# Patient Record
Sex: Female | Born: 1960 | Race: Black or African American | Hispanic: No | Marital: Married | Smoking: Former smoker
Health system: Southern US, Community
[De-identification: ages and names within clinical notes are randomized; demographics above are authoritative.]

## PROBLEM LIST (undated history)

## (undated) DIAGNOSIS — R609 Edema, unspecified: Secondary | ICD-10-CM

## (undated) DIAGNOSIS — J302 Other seasonal allergic rhinitis: Secondary | ICD-10-CM

## (undated) DIAGNOSIS — Z9109 Other allergy status, other than to drugs and biological substances: Secondary | ICD-10-CM

## (undated) DIAGNOSIS — B0089 Other herpesviral infection: Secondary | ICD-10-CM

## (undated) DIAGNOSIS — T7840XA Allergy, unspecified, initial encounter: Secondary | ICD-10-CM

## (undated) DIAGNOSIS — D869 Sarcoidosis, unspecified: Secondary | ICD-10-CM

## (undated) DIAGNOSIS — K219 Gastro-esophageal reflux disease without esophagitis: Secondary | ICD-10-CM

## (undated) DIAGNOSIS — E611 Iron deficiency: Secondary | ICD-10-CM

## (undated) DIAGNOSIS — M1990 Unspecified osteoarthritis, unspecified site: Secondary | ICD-10-CM

## (undated) HISTORY — DX: Edema, unspecified: R60.9

## (undated) HISTORY — PX: WISDOM TOOTH EXTRACTION: SHX21

## (undated) HISTORY — DX: Sarcoidosis, unspecified: D86.9

## (undated) HISTORY — DX: Unspecified osteoarthritis, unspecified site: M19.90

## (undated) HISTORY — PX: DILATION AND CURETTAGE OF UTERUS: SHX78

## (undated) HISTORY — DX: Gastro-esophageal reflux disease without esophagitis: K21.9

## (undated) HISTORY — DX: Iron deficiency: E61.1

## (undated) HISTORY — DX: Allergy, unspecified, initial encounter: T78.40XA

## (undated) HISTORY — DX: Other herpesviral infection: B00.89

## (undated) HISTORY — DX: Other seasonal allergic rhinitis: J30.2

## (undated) HISTORY — PX: TUBAL LIGATION: SHX77

---

## 1998-05-07 ENCOUNTER — Other Ambulatory Visit: Admission: RE | Admit: 1998-05-07 | Discharge: 1998-05-07 | Payer: Self-pay | Admitting: Internal Medicine

## 1999-02-02 ENCOUNTER — Encounter: Payer: Self-pay | Admitting: Internal Medicine

## 1999-02-02 ENCOUNTER — Encounter: Admission: RE | Admit: 1999-02-02 | Discharge: 1999-02-02 | Payer: Self-pay | Admitting: Internal Medicine

## 1999-03-28 ENCOUNTER — Other Ambulatory Visit: Admission: RE | Admit: 1999-03-28 | Discharge: 1999-03-28 | Payer: Self-pay | Admitting: Internal Medicine

## 1999-07-13 ENCOUNTER — Encounter: Payer: Self-pay | Admitting: Internal Medicine

## 1999-07-13 ENCOUNTER — Encounter: Admission: RE | Admit: 1999-07-13 | Discharge: 1999-07-13 | Payer: Self-pay | Admitting: Internal Medicine

## 2000-02-27 ENCOUNTER — Encounter: Admission: RE | Admit: 2000-02-27 | Discharge: 2000-02-27 | Payer: Self-pay | Admitting: Internal Medicine

## 2000-02-27 ENCOUNTER — Encounter: Payer: Self-pay | Admitting: Internal Medicine

## 2000-06-18 ENCOUNTER — Other Ambulatory Visit: Admission: RE | Admit: 2000-06-18 | Discharge: 2000-06-18 | Payer: Self-pay | Admitting: Internal Medicine

## 2002-02-03 ENCOUNTER — Other Ambulatory Visit: Admission: RE | Admit: 2002-02-03 | Discharge: 2002-02-03 | Payer: Self-pay | Admitting: Internal Medicine

## 2002-02-10 ENCOUNTER — Encounter: Admission: RE | Admit: 2002-02-10 | Discharge: 2002-02-10 | Payer: Self-pay | Admitting: Internal Medicine

## 2002-02-10 ENCOUNTER — Encounter: Payer: Self-pay | Admitting: Internal Medicine

## 2002-02-19 ENCOUNTER — Encounter: Admission: RE | Admit: 2002-02-19 | Discharge: 2002-02-19 | Payer: Self-pay | Admitting: Internal Medicine

## 2002-02-19 ENCOUNTER — Encounter: Payer: Self-pay | Admitting: Internal Medicine

## 2002-04-14 ENCOUNTER — Encounter: Payer: Self-pay | Admitting: Internal Medicine

## 2002-04-14 ENCOUNTER — Encounter: Admission: RE | Admit: 2002-04-14 | Discharge: 2002-04-14 | Payer: Self-pay | Admitting: Internal Medicine

## 2003-04-28 ENCOUNTER — Encounter: Admission: RE | Admit: 2003-04-28 | Discharge: 2003-04-28 | Payer: Self-pay | Admitting: Internal Medicine

## 2003-12-25 ENCOUNTER — Encounter: Admission: RE | Admit: 2003-12-25 | Discharge: 2003-12-25 | Payer: Self-pay | Admitting: Internal Medicine

## 2004-04-08 ENCOUNTER — Other Ambulatory Visit: Admission: RE | Admit: 2004-04-08 | Discharge: 2004-04-08 | Payer: Self-pay | Admitting: Internal Medicine

## 2005-03-03 ENCOUNTER — Encounter: Admission: RE | Admit: 2005-03-03 | Discharge: 2005-03-03 | Payer: Self-pay | Admitting: Internal Medicine

## 2005-07-14 ENCOUNTER — Other Ambulatory Visit: Admission: RE | Admit: 2005-07-14 | Discharge: 2005-07-14 | Payer: Self-pay | Admitting: Internal Medicine

## 2005-07-21 ENCOUNTER — Encounter: Admission: RE | Admit: 2005-07-21 | Discharge: 2005-07-21 | Payer: Self-pay | Admitting: Internal Medicine

## 2006-02-23 ENCOUNTER — Encounter: Admission: RE | Admit: 2006-02-23 | Discharge: 2006-02-23 | Payer: Self-pay | Admitting: Internal Medicine

## 2006-03-06 ENCOUNTER — Encounter: Admission: RE | Admit: 2006-03-06 | Discharge: 2006-03-06 | Payer: Self-pay | Admitting: Internal Medicine

## 2006-08-03 ENCOUNTER — Other Ambulatory Visit: Admission: RE | Admit: 2006-08-03 | Discharge: 2006-08-03 | Payer: Self-pay | Admitting: Internal Medicine

## 2007-03-08 ENCOUNTER — Encounter: Admission: RE | Admit: 2007-03-08 | Discharge: 2007-03-08 | Payer: Self-pay | Admitting: Internal Medicine

## 2007-08-05 ENCOUNTER — Other Ambulatory Visit: Admission: RE | Admit: 2007-08-05 | Discharge: 2007-08-05 | Payer: Self-pay | Admitting: Internal Medicine

## 2007-12-02 ENCOUNTER — Ambulatory Visit: Payer: Self-pay | Admitting: Internal Medicine

## 2007-12-10 ENCOUNTER — Ambulatory Visit: Payer: Self-pay | Admitting: Internal Medicine

## 2007-12-19 ENCOUNTER — Encounter: Admission: RE | Admit: 2007-12-19 | Discharge: 2007-12-19 | Payer: Self-pay | Admitting: Internal Medicine

## 2007-12-19 ENCOUNTER — Ambulatory Visit: Payer: Self-pay | Admitting: Internal Medicine

## 2008-04-13 ENCOUNTER — Ambulatory Visit: Payer: Self-pay | Admitting: Internal Medicine

## 2008-05-06 ENCOUNTER — Encounter: Admission: RE | Admit: 2008-05-06 | Discharge: 2008-05-06 | Payer: Self-pay | Admitting: Internal Medicine

## 2008-10-27 ENCOUNTER — Ambulatory Visit: Payer: Self-pay | Admitting: Internal Medicine

## 2008-10-27 ENCOUNTER — Other Ambulatory Visit: Admission: RE | Admit: 2008-10-27 | Discharge: 2008-10-27 | Payer: Self-pay | Admitting: Internal Medicine

## 2008-11-11 ENCOUNTER — Emergency Department (HOSPITAL_COMMUNITY): Admission: EM | Admit: 2008-11-11 | Discharge: 2008-11-11 | Payer: Self-pay | Admitting: Family Medicine

## 2009-02-09 ENCOUNTER — Ambulatory Visit: Payer: Self-pay | Admitting: Internal Medicine

## 2009-04-08 ENCOUNTER — Ambulatory Visit: Payer: Self-pay | Admitting: Internal Medicine

## 2009-05-18 ENCOUNTER — Encounter: Admission: RE | Admit: 2009-05-18 | Discharge: 2009-05-18 | Payer: Self-pay | Admitting: Internal Medicine

## 2009-11-01 ENCOUNTER — Ambulatory Visit: Payer: Self-pay | Admitting: Internal Medicine

## 2009-11-01 ENCOUNTER — Other Ambulatory Visit: Admission: RE | Admit: 2009-11-01 | Discharge: 2009-11-01 | Payer: Self-pay | Admitting: Internal Medicine

## 2009-11-01 LAB — HM PAP SMEAR

## 2010-01-03 ENCOUNTER — Emergency Department (HOSPITAL_COMMUNITY): Admission: EM | Admit: 2010-01-03 | Discharge: 2010-01-03 | Payer: Self-pay | Admitting: Family Medicine

## 2010-01-06 ENCOUNTER — Ambulatory Visit: Payer: Self-pay | Admitting: Internal Medicine

## 2010-01-19 ENCOUNTER — Ambulatory Visit
Admission: RE | Admit: 2010-01-19 | Discharge: 2010-01-19 | Payer: Self-pay | Source: Home / Self Care | Admitting: Internal Medicine

## 2010-05-12 LAB — POCT URINALYSIS DIP (DEVICE)
Bilirubin Urine: NEGATIVE
Glucose, UA: NEGATIVE mg/dL
Protein, ur: NEGATIVE mg/dL
Specific Gravity, Urine: 1.01 (ref 1.005–1.030)
Urobilinogen, UA: 0.2 mg/dL (ref 0.0–1.0)

## 2010-06-13 ENCOUNTER — Other Ambulatory Visit: Payer: Self-pay | Admitting: Internal Medicine

## 2010-06-13 DIAGNOSIS — Z1231 Encounter for screening mammogram for malignant neoplasm of breast: Secondary | ICD-10-CM

## 2010-06-20 ENCOUNTER — Ambulatory Visit
Admission: RE | Admit: 2010-06-20 | Discharge: 2010-06-20 | Disposition: A | Discharge status: Home / Self Care | Payer: BC Managed Care – PPO | Source: Ambulatory Visit | Attending: Internal Medicine | Admitting: Internal Medicine

## 2010-06-20 DIAGNOSIS — Z1231 Encounter for screening mammogram for malignant neoplasm of breast: Secondary | ICD-10-CM

## 2010-09-30 ENCOUNTER — Inpatient Hospital Stay (INDEPENDENT_AMBULATORY_CARE_PROVIDER_SITE_OTHER)
Admission: RE | Admit: 2010-09-30 | Discharge: 2010-09-30 | Disposition: A | Discharge status: Home / Self Care | Payer: BC Managed Care – PPO | Source: Ambulatory Visit | Attending: Emergency Medicine | Admitting: Emergency Medicine

## 2010-09-30 DIAGNOSIS — R05 Cough: Secondary | ICD-10-CM

## 2010-09-30 DIAGNOSIS — J069 Acute upper respiratory infection, unspecified: Secondary | ICD-10-CM

## 2010-09-30 LAB — POCT URINALYSIS DIP (DEVICE)
Bilirubin Urine: NEGATIVE
Ketones, ur: NEGATIVE mg/dL
Nitrite: NEGATIVE
Urobilinogen, UA: 0.2 mg/dL (ref 0.0–1.0)

## 2010-12-12 ENCOUNTER — Encounter: Payer: Self-pay | Admitting: Internal Medicine

## 2010-12-13 ENCOUNTER — Ambulatory Visit (INDEPENDENT_AMBULATORY_CARE_PROVIDER_SITE_OTHER): Payer: BC Managed Care – PPO | Admitting: Internal Medicine

## 2010-12-13 ENCOUNTER — Encounter: Payer: Self-pay | Admitting: Internal Medicine

## 2010-12-13 VITALS — BP 138/78 | HR 76 | Temp 98.5°F | Ht 61.0 in | Wt 160.0 lb

## 2010-12-13 DIAGNOSIS — N951 Menopausal and female climacteric states: Secondary | ICD-10-CM

## 2010-12-13 DIAGNOSIS — J309 Allergic rhinitis, unspecified: Secondary | ICD-10-CM | POA: Insufficient documentation

## 2010-12-13 DIAGNOSIS — Z23 Encounter for immunization: Secondary | ICD-10-CM

## 2010-12-13 DIAGNOSIS — Z0000 Encounter for general adult medical examination without abnormal findings: Secondary | ICD-10-CM

## 2010-12-13 DIAGNOSIS — Z862 Personal history of diseases of the blood and blood-forming organs and certain disorders involving the immune mechanism: Secondary | ICD-10-CM | POA: Insufficient documentation

## 2010-12-13 DIAGNOSIS — D869 Sarcoidosis, unspecified: Secondary | ICD-10-CM

## 2010-12-13 LAB — COMPREHENSIVE METABOLIC PANEL
AST: 29 U/L (ref 0–37)
Albumin: 4.6 g/dL (ref 3.5–5.2)
Alkaline Phosphatase: 85 U/L (ref 39–117)
BUN: 9 mg/dL (ref 6–23)
CO2: 26 mEq/L (ref 19–32)
Calcium: 9.5 mg/dL (ref 8.4–10.5)
Sodium: 141 mEq/L (ref 135–145)
Total Bilirubin: 0.5 mg/dL (ref 0.3–1.2)
Total Protein: 7.1 g/dL (ref 6.0–8.3)

## 2010-12-13 LAB — POCT URINALYSIS DIPSTICK
Blood, UA: NEGATIVE
Ketones, UA: NEGATIVE
Leukocytes, UA: NEGATIVE
Nitrite, UA: NEGATIVE
Protein, UA: NEGATIVE
pH, UA: 6

## 2010-12-13 LAB — CBC WITH DIFFERENTIAL/PLATELET
Basophils Absolute: 0 10*3/uL (ref 0.0–0.1)
HCT: 38.1 % (ref 36.0–46.0)
Lymphocytes Relative: 50 % — ABNORMAL HIGH (ref 12–46)
MCV: 90.9 fL (ref 78.0–100.0)
Monocytes Absolute: 0.5 10*3/uL (ref 0.1–1.0)
Monocytes Relative: 6 % (ref 3–12)
Platelets: 293 10*3/uL (ref 150–400)
WBC: 8.2 10*3/uL (ref 4.0–10.5)

## 2010-12-13 LAB — LIPID PANEL
LDL Cholesterol: 105 mg/dL — ABNORMAL HIGH (ref 0–99)
Triglycerides: 60 mg/dL (ref <150)

## 2010-12-13 NOTE — Patient Instructions (Signed)
Take your blood pressure several times a week at home and call if blood pressure is greater than 1:30 systolically or 85 diastolically.

## 2010-12-13 NOTE — Progress Notes (Signed)
  Subjective:    Patient ID: Laura Silva, female    DOB: Jun 09, 1960, 50 y.o.   MRN: [id number]  HPI 50 year old black female patient here since 1989 when she presented with fever and cough. She was found to have a chest x-ray consistent with sarcoidosis. Was treated by Dr. Jetty Duhamel with steroids and went into remission with no further episodes. In 1990, Dr. Maple Hudson did allergy testing on her and it was positive for fall weeds, grass, several tree pollens, dust, dust mites, tobacco and mold. She takes over-the-counter allergy medication. Her blood pressures been elevated today but when I rechecked it I got 120/80. She checks it at home and we'll keep an eye on this. History of de Quervain's tenosynovitis November 2002 right wrist. History of bacterial vaginosis. Had tubal ligation 1992. Tetanus immunization given at [facility] hospital 2003. Hepatitis B series given at [facility] hospital in 2003. Last menstrual period was August 2012. FSH is drawn. Patient does not smoke. Works in [employer] at [employer]. Has 2 adult children and is married.  Family history father died with history of diabetes complications. Mother with history of hypertension and hyperlipidemia. One sister with hypertension and diabetes. One sister died with some type of intestinal cancer. One sister morbidly obese had bariatric surgery. Patient has a daughter from a previous relationship his teenage son was killed in a tragic car accident in 2011.    Review of Systems  Constitutional: Negative.   HENT: Negative.   Eyes: Negative.   Respiratory: Negative.   Cardiovascular: Negative.   Gastrointestinal: Negative.   Genitourinary:       Initial. Since August 2012. Last Pap smear September 2011  Neurological: Negative.   Hematological: Negative.   Psychiatric/Behavioral: Negative.        Objective:   Physical Exam  Vitals reviewed. Constitutional: She appears well-developed and well-nourished.  HENT:  Head: Normocephalic  and atraumatic.  Right Ear: External ear normal.  Left Ear: External ear normal.  Mouth/Throat: Oropharynx is clear and moist.  Eyes: Conjunctivae and EOM are normal. Pupils are equal, round, and reactive to light. No scleral icterus.  Neck: Neck supple. No JVD present. No thyromegaly present.  Cardiovascular: Normal rate, regular rhythm, normal heart sounds and intact distal pulses.   No murmur heard. Pulmonary/Chest: Effort normal and breath sounds normal. She has no wheezes. She has no rales.       Breasts normal female  Abdominal: Soft. Bowel sounds are normal. She exhibits no distension and no mass. There is no tenderness.  Genitourinary:       Bimanual exam normal  Musculoskeletal: She exhibits no edema.  Lymphadenopathy:    She has no cervical adenopathy.  Skin: Skin is warm and dry.  Psychiatric: She has a normal mood and affect. Her behavior is normal.          Assessment & Plan:  History of sarcoidosis-now in remission  Allergic rhinitis  Mild obesity  Plan: At her request phentermine 37.5 mg (#100) one by mouth every morning with no refill. Patient is given on her blood pressure at home and call me if blood pressure is exceeding 1:30 systolically or 85 diastolically on a regular basis. Return one year or as needed. Needs to consider screening colonoscopy and is due for mammogram.

## 2010-12-14 LAB — FOLLICLE STIMULATING HORMONE: FSH: 103.3 m[IU]/mL

## 2010-12-14 LAB — VITAMIN D 25 HYDROXY (VIT D DEFICIENCY, FRACTURES): Vit D, 25-Hydroxy: 39 ng/mL (ref 30–89)

## 2011-02-07 HISTORY — PX: COLONOSCOPY: SHX174

## 2011-02-21 ENCOUNTER — Telehealth: Payer: Self-pay

## 2011-02-24 NOTE — Telephone Encounter (Signed)
Patient wants colonoscopy scheduled for June 2013, but [facility] does not have their schedule till April. Patient informed.

## 2011-03-13 ENCOUNTER — Ambulatory Visit: Payer: BC Managed Care – PPO | Admitting: Internal Medicine

## 2011-04-06 ENCOUNTER — Ambulatory Visit
Admission: RE | Admit: 2011-04-06 | Discharge: 2011-04-06 | Disposition: A | Discharge status: Home / Self Care | Payer: BC Managed Care – PPO | Source: Ambulatory Visit | Attending: Internal Medicine | Admitting: Internal Medicine

## 2011-04-06 ENCOUNTER — Encounter: Payer: Self-pay | Admitting: Internal Medicine

## 2011-04-06 ENCOUNTER — Ambulatory Visit (INDEPENDENT_AMBULATORY_CARE_PROVIDER_SITE_OTHER): Payer: BC Managed Care – PPO | Admitting: Internal Medicine

## 2011-04-06 VITALS — BP 130/82 | HR 72 | Temp 98.9°F | Wt 174.0 lb

## 2011-04-06 DIAGNOSIS — M5430 Sciatica, unspecified side: Secondary | ICD-10-CM

## 2011-04-06 DIAGNOSIS — G5600 Carpal tunnel syndrome, unspecified upper limb: Secondary | ICD-10-CM

## 2011-04-06 DIAGNOSIS — G5603 Carpal tunnel syndrome, bilateral upper limbs: Secondary | ICD-10-CM

## 2011-04-06 DIAGNOSIS — K5900 Constipation, unspecified: Secondary | ICD-10-CM

## 2011-04-06 DIAGNOSIS — R52 Pain, unspecified: Secondary | ICD-10-CM

## 2011-04-06 DIAGNOSIS — R143 Flatulence: Secondary | ICD-10-CM

## 2011-04-06 DIAGNOSIS — R140 Abdominal distension (gaseous): Secondary | ICD-10-CM

## 2011-04-07 LAB — CBC WITH DIFFERENTIAL/PLATELET
Basophils Absolute: 0 10*3/uL (ref 0.0–0.1)
Eosinophils Absolute: 0.1 10*3/uL (ref 0.0–0.7)
HCT: 37 % (ref 36.0–46.0)
Lymphs Abs: 3.8 10*3/uL (ref 0.7–4.0)
MCH: 28.8 pg (ref 26.0–34.0)
Monocytes Relative: 6 % (ref 3–12)
Platelets: 318 10*3/uL (ref 150–400)
RBC: 4.16 MIL/uL (ref 3.87–5.11)

## 2011-04-07 LAB — ANA: Anti Nuclear Antibody(ANA): NEGATIVE

## 2011-04-07 LAB — SEDIMENTATION RATE: Sed Rate: 10 mm/hr (ref 0–22)

## 2011-04-27 ENCOUNTER — Ambulatory Visit (INDEPENDENT_AMBULATORY_CARE_PROVIDER_SITE_OTHER): Payer: BC Managed Care – PPO | Admitting: Internal Medicine

## 2011-04-27 ENCOUNTER — Encounter: Payer: Self-pay | Admitting: Internal Medicine

## 2011-04-27 VITALS — BP 120/62 | HR 100 | Temp 98.0°F | Ht 61.0 in | Wt 164.0 lb

## 2011-04-27 DIAGNOSIS — K5900 Constipation, unspecified: Secondary | ICD-10-CM

## 2011-04-27 DIAGNOSIS — M5432 Sciatica, left side: Secondary | ICD-10-CM

## 2011-04-27 DIAGNOSIS — M5430 Sciatica, unspecified side: Secondary | ICD-10-CM

## 2011-04-27 DIAGNOSIS — Z8669 Personal history of other diseases of the nervous system and sense organs: Secondary | ICD-10-CM

## 2011-04-27 NOTE — Patient Instructions (Signed)
Would suggest you return to [employer] for weight loss. Continue diet and exercise. Call if numbness in foot continues beyond 6 weeks or symptoms worsen.

## 2011-04-27 NOTE — Progress Notes (Signed)
  Subjective:    Patient ID: Laura Silva, female    DOB: February 15, 1960, 51 y.o.   MRN: [id number]  HPI  51 year old black female with recent bout of sciatica left leg improved with physical therapy. She went 4 times for physical therapy. Still occasionally having some pain down left lateral leg. Occasionally foot gets a bit numb but not weak. She took steroids but she said they made her "crazy ". Overall is doing well. She's pleased with her weight loss. We had limited her work for light duty but apparently no light duty was available so she just did her work a little more slowly. This worked out okay. No longer complaining of abdominal bloating.    Review of Systems     Objective:   Physical Exam straight leg raising is negative at 90 bilaterally. Muscle strength is normal. Sensation intact in left lower leg.        Assessment & Plan:  Left sciatica improved  Abdominal bloating thought to be due to constipation improved  Plan: Given phentermine 37.5 mg (#30) 1 by mouth daily for weight loss. No refills will be given on this. She understands this. She should return to [employer]. Return when necessary.

## 2011-05-06 NOTE — Patient Instructions (Signed)
He may wear night splints for carpal tunnel syndrome. Sterapred DS 10 mg 6 day dosepak for sciatica and hopefully it will help carpal tunnel syndrome as well. Abdominal film shows constipation. Recommend MiraLAX daily. Return in 3 weeks. Consider physical therapy. Prescription given.

## 2011-05-06 NOTE — Progress Notes (Signed)
  Subjective:    Patient ID: Laura Silva, female    DOB: [birth year], 51 y.o.   MRN: [id number]  HPI 51 year old black female with remote history of sarcoidosis in today with multiple complaints. She is complaining of abdominal bloating. Feels that she has gained weight. Feels always distended in her abdomen. Says she is having regular bowel movements. Also complaining of pain in buttock radiating somewhat into upper posterior thigh. She works in Public affairs consultant. Also has had some numbness in her fingers both hands. She had a sister that died with some type of GI cancer.    Review of Systems     Objective:   Physical Exam abdomen obese but nondistended with no hepatosplenomegaly masses or tenderness; positive Phalen and Tinel signs bilaterally consistent with carpal tunnel syndrome both reassess; straight leg raising is negative at 90 bilaterally. Deep tendon reflexes 2+ and symmetrical. Muscle strength in the lower extremities is normal. KUB shows constipation.        Assessment & Plan:  Sciatica  Carpal tunnel syndrome  Constipation  Plan: Sterapred DS 10 mg 6 day dosepak. Bilateral night sweats will help carpal tunnel syndrome hopefully. If not, can refer to hand surgeon. Abdominal bloating seems to be due to constipation. Recommend MiraLAX daily. Return for followup in 2-4 weeks. Light duty at work. Consider physical therapy.

## 2011-05-30 ENCOUNTER — Encounter: Payer: Self-pay | Admitting: Gastroenterology

## 2011-06-01 ENCOUNTER — Encounter: Payer: Self-pay | Admitting: Internal Medicine

## 2011-06-21 ENCOUNTER — Telehealth: Payer: Self-pay

## 2011-06-21 DIAGNOSIS — Z1211 Encounter for screening for malignant neoplasm of colon: Secondary | ICD-10-CM

## 2011-06-26 NOTE — Telephone Encounter (Signed)
Colonoscopy scheduled for July.

## 2011-08-04 ENCOUNTER — Encounter: Payer: BC Managed Care – PPO | Admitting: Internal Medicine

## 2011-08-08 ENCOUNTER — Encounter: Payer: Self-pay | Admitting: Internal Medicine

## 2011-08-08 ENCOUNTER — Ambulatory Visit (AMBULATORY_SURGERY_CENTER): Payer: BC Managed Care – PPO

## 2011-08-08 VITALS — Ht 61.0 in | Wt 166.9 lb

## 2011-08-08 DIAGNOSIS — Z800 Family history of malignant neoplasm of digestive organs: Secondary | ICD-10-CM

## 2011-08-08 DIAGNOSIS — Z1211 Encounter for screening for malignant neoplasm of colon: Secondary | ICD-10-CM

## 2011-08-08 DIAGNOSIS — Z8371 Family history of colonic polyps: Secondary | ICD-10-CM

## 2011-08-08 MED ORDER — MOVIPREP 100 G PO SOLR: ORAL | Status: DC

## 2011-08-22 ENCOUNTER — Ambulatory Visit (AMBULATORY_SURGERY_CENTER): Payer: BC Managed Care – PPO | Admitting: Internal Medicine

## 2011-08-22 ENCOUNTER — Encounter: Payer: Self-pay | Admitting: Internal Medicine

## 2011-08-22 VITALS — BP 149/73 | HR 81 | Temp 97.9°F | Resp 20 | Ht 61.0 in | Wt 166.0 lb

## 2011-08-22 DIAGNOSIS — D126 Benign neoplasm of colon, unspecified: Secondary | ICD-10-CM

## 2011-08-22 DIAGNOSIS — Z1211 Encounter for screening for malignant neoplasm of colon: Secondary | ICD-10-CM

## 2011-08-22 MED ORDER — SODIUM CHLORIDE 0.9 % IV SOLN: 500.0000 mL | INTRAVENOUS | Status: DC

## 2011-08-22 NOTE — Progress Notes (Signed)
Patient did not experience any of the following events: a burn prior to discharge; a fall within the facility; wrong site/side/patient/procedure/implant event; or a hospital transfer or hospital admission upon discharge from the facility. (G8907) Patient did not have preoperative order for IV antibiotic SSI prophylaxis. (G8918) Patient did not have preoperative order for IV antibiotic SSI prophylaxis. (G8918)  

## 2011-08-22 NOTE — Patient Instructions (Addendum)
One very small polyp was removed. Otherwise normal colonoscopy. I will let you know the polyp pathology results and timning of next colonoscopy (5-10 years).  Thank you for choosing me and [facility].  Iva Boop, MD, FACG  YOU HAD AN ENDOSCOPIC PROCEDURE TODAY AT THE [facility]: Refer to the procedure report that was given to you for any specific questions about what was found during the examination.  If the procedure report does not answer your questions, please call your gastroenterologist to clarify.  If you requested that your care partner not be given the details of your procedure findings, then the procedure report has been included in a sealed envelope for you to review at your convenience later.  YOU SHOULD EXPECT: Some feelings of bloating in the abdomen. Passage of more gas than usual.  Walking can help get rid of the air that was put into your GI tract during the procedure and reduce the bloating. If you had a lower endoscopy (such as a colonoscopy or flexible sigmoidoscopy) you may notice spotting of blood in your stool or on the toilet paper. If you underwent a bowel prep for your procedure, then you may not have a normal bowel movement for a few days.  DIET: Your first meal following the procedure should be a light meal and then it is ok to progress to your normal diet.  A half-sandwich or bowl of soup is an example of a good first meal.  Heavy or fried foods are harder to digest and may make you feel nauseous or bloated.  Likewise meals heavy in dairy and vegetables can cause extra gas to form and this can also increase the bloating.  Drink plenty of fluids but you should avoid alcoholic beverages for 24 hours.  ACTIVITY: Your care partner should take you home directly after the procedure.  You should plan to take it easy, moving slowly for the rest of the day.  You can resume normal activity the day after the procedure however you should NOT DRIVE or use  heavy machinery for 24 hours (because of the sedation medicines used during the test).    SYMPTOMS TO REPORT IMMEDIATELY: A gastroenterologist can be reached at any hour.  During normal business hours, 8:30 AM to 5:00 PM Monday through Friday, call [phone].  After hours and on weekends, please call the GI answering service at [phone] who will take a message and have the physician on call contact you.   Following lower endoscopy (colonoscopy or flexible sigmoidoscopy):  Excessive amounts of blood in the stool  Significant tenderness or worsening of abdominal pains  Swelling of the abdomen that is new, acute  Fever of 100F or higher  Following upper endoscopy (EGD)  Vomiting of blood or coffee ground material  New chest pain or pain under the shoulder blades  Painful or persistently difficult swallowing  New shortness of breath  Fever of 100F or higher  Black, tarry-looking stools  FOLLOW UP: If any biopsies were taken you will be contacted by phone or by letter within the next 1-3 weeks.  Call your gastroenterologist if you have not heard about the biopsies in 3 weeks.  Our staff will call the home number listed on your records the next business day following your procedure to check on you and address any questions or concerns that you may have at that time regarding the information given to you following your procedure. This is a courtesy call and so if  there is no answer at the home number and we have not heard from you through the emergency physician on call, we will assume that you have returned to your regular daily activities without incident.  SIGNATURES/CONFIDENTIALITY: You and/or your care partner have signed paperwork which will be entered into your electronic medical record.  These signatures attest to the fact that that the information above on your After Visit Summary has been reviewed and is understood.  Full responsibility of the confidentiality of this  discharge information lies with you and/or your care-partner.   Polypectomy information given.

## 2011-08-22 NOTE — Op Note (Signed)
[facility] [address] [place], [place]  [address]  COLONOSCOPY PROCEDURE REPORT  PATIENT:  Laura Silva, Laura Silva  MR#:  [id number] BIRTHDATE:  May 27, 1960, 50 yrs. old  GENDER:  female ENDOSCOPIST:  Iva Boop, MD, [facility] REF. BY:  Sharlet Salina, M.D. PROCEDURE DATE:  08/22/2011 PROCEDURE:  Colonoscopy with snare polypectomy ASA CLASS:  Class II INDICATIONS:  Routine Risk Screening MEDICATIONS:   These medications were titrated to patient response per physician's verbal order, MAC sedation, administered by CRNA, propofol (Diprivan) 250 mg IV  DESCRIPTION OF PROCEDURE:   After the risks benefits and alternatives of the procedure were thoroughly explained, informed consent was obtained.  Digital rectal exam was performed and revealed no abnormalities.   The LB CF-[id number] endoscope was introduced through the anus and advanced to the cecum, which was identified by both the appendix and ileocecal valve, without limitations.  The quality of the prep was excellent, using MoviPrep.  The instrument was then slowly withdrawn as the colon was fully examined. <<PROCEDUREIMAGES>>  FINDINGS:  A diminutive 3-82mm polyp was found in the descending colon. Polyp was snared without cautery. Retrieval was successful. This was otherwise a normal examination of the colon. Includes right colon retroflexion.   Retroflexed views in the rectum revealed no abnormalities.    The time to cecum = 2:28 minutes. The scope was then withdrawn in 10:24 minutes from the cecum and the procedure completed. COMPLICATIONS:  None ENDOSCOPIC IMPRESSION: 1) Diminutive polyp in the descending colon - removed 2) Otherwise normal examination. excellent prep  REPEAT EXAM:  In for Colonoscopy, pending biopsy results.  Iva Boop, MD, [facility]  CC:  Sharlet Salina, MD and The Patient  n. eSIGNED:   Iva Boop at 08/22/2011 08:53 AM  Corey Skains, [phone]

## 2011-08-23 ENCOUNTER — Telehealth: Payer: Self-pay | Admitting: *Deleted

## 2011-08-23 NOTE — Telephone Encounter (Signed)
  Follow up Call-  Call back number 08/22/2011  Post procedure Call Back phone  # [phone]  Permission to leave phone message Yes     Patient questions:  Do you have a fever, pain , or abdominal swelling? no Pain Score  0 *  Have you tolerated food without any problems? yes  Have you been able to return to your normal activities? yes  Do you have any questions about your discharge instructions: Diet   no Medications  no Follow up visit  no  Do you have questions or concerns about your Care? no  Actions: * If pain score is 4 or above: No action needed, pain <4.

## 2011-08-29 ENCOUNTER — Encounter: Payer: Self-pay | Admitting: Internal Medicine

## 2011-08-29 NOTE — Progress Notes (Signed)
Quick Note:  Diminutive hyperplastic polyp Repeat colonoscopy about 2023 ______

## 2011-10-12 ENCOUNTER — Other Ambulatory Visit: Payer: Self-pay | Admitting: Internal Medicine

## 2011-10-12 DIAGNOSIS — Z1231 Encounter for screening mammogram for malignant neoplasm of breast: Secondary | ICD-10-CM

## 2011-11-06 ENCOUNTER — Ambulatory Visit
Admission: RE | Admit: 2011-11-06 | Discharge: 2011-11-06 | Disposition: A | Discharge status: Home / Self Care | Payer: BC Managed Care – PPO | Source: Ambulatory Visit | Attending: Internal Medicine | Admitting: Internal Medicine

## 2011-11-06 DIAGNOSIS — Z1231 Encounter for screening mammogram for malignant neoplasm of breast: Secondary | ICD-10-CM

## 2012-01-12 ENCOUNTER — Other Ambulatory Visit: Payer: BC Managed Care – PPO | Admitting: Internal Medicine

## 2012-01-12 DIAGNOSIS — Z0000 Encounter for general adult medical examination without abnormal findings: Secondary | ICD-10-CM

## 2012-01-12 LAB — CBC WITH DIFFERENTIAL/PLATELET
Basophils Absolute: 0 10*3/uL (ref 0.0–0.1)
Basophils Relative: 0 % (ref 0–1)
Eosinophils Relative: 1 % (ref 0–5)
HCT: 36.2 % (ref 36.0–46.0)
Hemoglobin: 12 g/dL (ref 12.0–15.0)
Lymphocytes Relative: 46 % (ref 12–46)
MCHC: 33.1 g/dL (ref 30.0–36.0)
MCV: 89.4 fL (ref 78.0–100.0)
Monocytes Absolute: 0.6 10*3/uL (ref 0.1–1.0)
Monocytes Relative: 8 % (ref 3–12)
RDW: 13.6 % (ref 11.5–15.5)

## 2012-01-12 LAB — LIPID PANEL
HDL: 71 mg/dL (ref >39)
Total CHOL/HDL Ratio: 2.9 Ratio
Triglycerides: 37 mg/dL (ref <150)

## 2012-01-12 LAB — COMPREHENSIVE METABOLIC PANEL
AST: 25 U/L (ref 0–37)
Albumin: 4.5 g/dL (ref 3.5–5.2)
BUN: 14 mg/dL (ref 6–23)
Calcium: 10 mg/dL (ref 8.4–10.5)
Chloride: 105 mEq/L (ref 96–112)
Creat: 0.88 mg/dL (ref 0.50–1.10)
Glucose, Bld: 83 mg/dL (ref 70–99)

## 2012-01-12 LAB — TSH: TSH: 1.812 u[IU]/mL (ref 0.350–4.500)

## 2012-01-15 ENCOUNTER — Encounter: Payer: Self-pay | Admitting: Internal Medicine

## 2012-01-15 ENCOUNTER — Ambulatory Visit (INDEPENDENT_AMBULATORY_CARE_PROVIDER_SITE_OTHER): Payer: BC Managed Care – PPO | Admitting: Internal Medicine

## 2012-01-15 VITALS — BP 130/82 | HR 64 | Temp 98.2°F | Ht 61.0 in | Wt 161.0 lb

## 2012-01-15 DIAGNOSIS — Z0000 Encounter for general adult medical examination without abnormal findings: Secondary | ICD-10-CM

## 2012-01-15 DIAGNOSIS — Z8709 Personal history of other diseases of the respiratory system: Secondary | ICD-10-CM

## 2012-01-15 DIAGNOSIS — Z8669 Personal history of other diseases of the nervous system and sense organs: Secondary | ICD-10-CM

## 2012-01-15 DIAGNOSIS — Z23 Encounter for immunization: Secondary | ICD-10-CM

## 2012-01-15 DIAGNOSIS — Z862 Personal history of diseases of the blood and blood-forming organs and certain disorders involving the immune mechanism: Secondary | ICD-10-CM

## 2012-01-15 LAB — POCT URINALYSIS DIPSTICK
Blood, UA: NEGATIVE
Glucose, UA: NEGATIVE
Spec Grav, UA: 1.01
Urobilinogen, UA: NEGATIVE
pH, UA: 7.5

## 2012-01-15 MED ORDER — TETANUS-DIPHTH-ACELL PERTUSSIS 5-2.5-18.5 LF-MCG/0.5 IM SUSP: 0.5000 mL | Freq: Once | INTRAMUSCULAR | Status: DC

## 2012-04-06 NOTE — Progress Notes (Signed)
  Subjective:    Patient ID: Laura Silva, female    DOB: 05-23-1960, 52 y.o.   MRN: [id number]  HPI 52 year old Black female with remote history of sarcoidosis in 1989 treated with Dr. Filbert Schilder young with steroids with no further exacerbations. History of allergic rhinitis. Was tested by Dr. Maple Hudson in 1990 and was found to have allergies to weeds, grass, tree pollens, dust, dust mites, tobacco and molds. She takes over-the-counter allergy medications. History of  de Quervain's tenosynovitis November 2002 right wrist history bacterial vaginosis. Had tubal ligation 1992. Tetanus immunization 2003. Hepatitis B series given at [facility] in 2003. 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  She is married. Has a daughter from a previous relationship his teenage son was killed in a tragic car accident in 2011. One son from her marriage works at [employer] and resides at home. Patient does not smoke. She is a former smoker but quit in 1982. She's been a patient in this practice since 1999 when she presented with a cough and was diagnosed with sarcoidosis.  Family history: Father died with history of diabetes complications. Mother with history of hypertension and hyperlipidemia. One sister with hypertension and diabetes. One sister died with some type of intestinal cancer. One sister with history of morbid obesity who has had bariatric surgery.  Patient is also had a history of sciatica.    Review of Systems  Constitutional: Negative.   All other systems reviewed and are negative.       Objective:   Physical Exam  Vitals reviewed. Constitutional: She is oriented to person, place, and time. She appears well-developed and well-nourished. No distress.  HENT:  Head: Normocephalic and atraumatic.  Right Ear: External ear normal.  Left Ear: External ear normal.  Mouth/Throat: Oropharynx is clear and moist. No oropharyngeal exudate.  Eyes:  Conjunctivae and EOM are normal. Pupils are equal, round, and reactive to light. Left eye exhibits no discharge.  Neck: Neck supple. No JVD present. No thyromegaly present.  Cardiovascular: Normal rate, regular rhythm, normal heart sounds and intact distal pulses.   No murmur heard. Pulmonary/Chest: Effort normal and breath sounds normal. No respiratory distress. She has no wheezes. She has no rales.  Breasts normal female  Abdominal: Bowel sounds are normal.  Musculoskeletal: She exhibits no edema.  Lymphadenopathy:    She has no cervical adenopathy.  Neurological: She is alert and oriented to person, place, and time. She has normal reflexes. No cranial nerve deficit. Coordination normal.  Skin: Skin is warm and dry. No rash noted. She is not diaphoretic.  Psychiatric: She has a normal mood and affect. Her behavior is normal. Judgment and thought content normal.          Assessment & Plan:  History of sarcoidosis treated in 1989 without further recurrence  History of allergic rhinitis  History of sciatica  Plan: Return in one year or as needed.

## 2012-04-07 NOTE — Patient Instructions (Addendum)
Watch diet and exercise. Return in one year

## 2012-05-10 ENCOUNTER — Other Ambulatory Visit: Payer: Self-pay

## 2012-05-10 ENCOUNTER — Other Ambulatory Visit: Payer: Self-pay | Admitting: Internal Medicine

## 2012-05-10 MED ORDER — METRONIDAZOLE 1 % EX GEL: Freq: Every day | CUTANEOUS | Status: DC

## 2012-07-02 ENCOUNTER — Encounter (HOSPITAL_COMMUNITY): Payer: Self-pay | Admitting: *Deleted

## 2012-07-02 ENCOUNTER — Emergency Department (HOSPITAL_COMMUNITY)
Admission: EM | Admit: 2012-07-02 | Discharge: 2012-07-02 | Disposition: A | Discharge status: Home / Self Care | Payer: BC Managed Care – PPO | Source: Home / Self Care | Attending: Emergency Medicine | Admitting: Emergency Medicine

## 2012-07-02 DIAGNOSIS — J302 Other seasonal allergic rhinitis: Secondary | ICD-10-CM

## 2012-07-02 DIAGNOSIS — H01005 Unspecified blepharitis left lower eyelid: Secondary | ICD-10-CM

## 2012-07-02 DIAGNOSIS — H01009 Unspecified blepharitis unspecified eye, unspecified eyelid: Secondary | ICD-10-CM

## 2012-07-02 DIAGNOSIS — J309 Allergic rhinitis, unspecified: Secondary | ICD-10-CM

## 2012-07-02 HISTORY — DX: Other allergy status, other than to drugs and biological substances: Z91.09

## 2012-07-02 MED ORDER — FLUTICASONE PROPIONATE 50 MCG/ACT NA SUSP: 2.0000 | Freq: Every day | NASAL | Status: DC

## 2012-07-02 MED ORDER — TOBRAMYCIN 0.3 % OP SOLN: 2.0000 [drp] | Freq: Four times a day (QID) | OPHTHALMIC | Status: AC

## 2012-07-02 NOTE — ED Notes (Signed)
C/O left eye irritation, tearing, and "heavy" feeling since Thurs.  Denies any matting or crusting.  Has been taking her normal Claritin.  Also c/o congestion left ear.

## 2012-07-02 NOTE — ED Provider Notes (Signed)
History     CSN: [id number]  Arrival date & time 07/02/12  7253   First MD Initiated Contact with Patient 07/02/12 1958      Chief Complaint  Patient presents with  . Eye Problem    (Consider location/radiation/quality/duration/timing/severity/associated sxs/prior treatment) HPI Comments: Patient presents urgent care describing that since Thursday she's been having left eye discomfort irritation and no swelling mainly in the lower eyelid. It's been coming progressively worse. Especially when she touches the area. She has also been having, allergy-type symptoms she describes as a runny and congested nose frequent sneezing he has been taking some Claritin and over-the-counter nasal spray. Denies any cough shortness of breath or wheezing or rashes.  Patient is a 52 y.o. female presenting with eye problem. The history is provided by the patient.  Eye Problem Location:  L eye Quality:  Aching and tearing Severity:  Moderate Onset quality:  Gradual Progression:  Worsening Context: not foreign body, not using machinery, not scratch, not smoke exposure and not tanning booth use   Relieved by:  Nothing Associated symptoms: discharge, inflammation, itching, redness, swelling and tearing   Associated symptoms: no blurred vision, no double vision, no facial rash, no headaches, no nausea, no vomiting and no weakness   Risk factors: no conjunctival hemorrhage     Past Medical History  Diagnosis Date  . Allergy   . Sarcoidosis   . Herpes simplex virus type 1 (HSV-1) dermatitis   . Fluid retention   . Constipation   . Iron deficiency   . Environmental allergies     Past Surgical History  Procedure Laterality Date  . Tubal ligation      Family History  Problem Relation Age of Onset  . Hyperlipidemia Mother   . Hypertension Mother   . Diabetes Mother   . Heart disease Mother   . Diabetes Father   . Cancer Sister   . Colon cancer Paternal Uncle     History  Substance Use  Topics  . Smoking status: Former Smoker    Types: Cigarettes    Quit date: 12/12/1980  . Smokeless tobacco: Never Used  . Alcohol Use: No    OB History   Grav Para Term Preterm Abortions TAB SAB Ect Mult Living                  Review of Systems  Constitutional: Negative for activity change and appetite change.  HENT: Positive for congestion and rhinorrhea. Negative for ear pain, sneezing and postnasal drip.   Eyes: Positive for discharge, redness and itching. Negative for blurred vision, double vision and visual disturbance.  Gastrointestinal: Negative for nausea and vomiting.  Musculoskeletal: Negative for arthralgias.  Skin: Negative for color change, pallor, rash and wound.  Neurological: Negative for weakness and headaches.    Allergies  Review of patient's allergies indicates no known allergies.  Home Medications   Current Outpatient Rx  Name  Route  Sig  Dispense  Refill  . Loratadine (CLARITIN PO)   Oral   Take by mouth.         . calcium carbonate (OS-CAL) 600 MG TABS   Oral   Take 600 mg by mouth 2 (two) times daily with a meal.           . fluticasone (FLONASE) 50 MCG/ACT nasal spray   Nasal   Place 2 sprays into the nose daily.   16 g   2   . HYDROcodone-acetaminophen (VICODIN) 5-500 MG per tablet  Oral   Take 1 tablet by mouth every 6 (six) hours as needed.         Marland Kitchen ibuprofen (ADVIL,MOTRIN) 200 MG tablet   Oral   Take 200 mg by mouth every 6 (six) hours as needed. Take 2 tabs  Every 4 hours prn         . metroNIDAZOLE (METROGEL) 1 % gel   Topical   Apply topically daily.   60 g   3   . Multiple Vitamin (MULTIVITAMIN) tablet   Oral   Take 1 tablet by mouth daily.           . NON FORMULARY      Green tea diet pills-Take 2 tablets daily         . Polyethylene Glycol 3350 (MIRALAX PO)   Oral   Take by mouth.           . tobramycin (TOBREX) 0.3 % ophthalmic solution   Left Eye   Place 2 drops into the left eye every 6  (six) hours.   5 mL   0     BP 128/75  Pulse 68  Temp(Src) 98 F (36.7 C) (Oral)  Resp 18  SpO2 100%  Physical Exam  Nursing note and vitals reviewed. Constitutional: Vital signs are normal. She appears well-developed and well-nourished.  Non-toxic appearance. She does not have a sickly appearance. She does not appear ill. No distress.  HENT:  Head: Normocephalic.  Mouth/Throat: No oropharyngeal exudate.  Eyes: No foreign bodies found. Right eye exhibits no discharge. Left eye exhibits no discharge. No foreign body present in the left eye. Right conjunctiva is not injected. Right conjunctiva has no hemorrhage. Left conjunctiva is injected. Left conjunctiva has no hemorrhage. No scleral icterus.  Slit lamp exam:      The right eye shows no corneal abrasion, no corneal flare and no foreign body.       The left eye shows no corneal abrasion, no corneal flare and no foreign body.    Neck: Neck supple. No JVD present.  Abdominal: Soft.  Lymphadenopathy:    She has no cervical adenopathy.  Neurological: She is alert.  Skin: No rash noted.    ED Course  Procedures (including critical care time)  Labs Reviewed - No data to display No results found.   1. Blepharitis of left lower eyelid   2. Seasonal allergies       MDM  Seasonal allergies with a coexistent left lower eyelid blepharitis most likely a bacterial secondary infection. Will treat patient with a topical ophthalmic antibiotic, and will optimize her anti-allergenic regimen with a nasal steroid spray. Encourage her to continue taking Claritin  Jimmie Molly, MD 07/02/12 2033

## 2012-11-20 ENCOUNTER — Other Ambulatory Visit: Payer: Self-pay

## 2012-11-20 DIAGNOSIS — Z1231 Encounter for screening mammogram for malignant neoplasm of breast: Secondary | ICD-10-CM

## 2012-12-13 ENCOUNTER — Ambulatory Visit
Admission: RE | Admit: 2012-12-13 | Discharge: 2012-12-13 | Disposition: A | Discharge status: Home / Self Care | Payer: BC Managed Care – PPO | Source: Ambulatory Visit

## 2012-12-13 DIAGNOSIS — Z1231 Encounter for screening mammogram for malignant neoplasm of breast: Secondary | ICD-10-CM

## 2013-02-04 ENCOUNTER — Ambulatory Visit (INDEPENDENT_AMBULATORY_CARE_PROVIDER_SITE_OTHER): Payer: BC Managed Care – PPO | Admitting: Internal Medicine

## 2013-02-04 ENCOUNTER — Other Ambulatory Visit (HOSPITAL_COMMUNITY)
Admission: RE | Admit: 2013-02-04 | Discharge: 2013-02-04 | Disposition: A | Discharge status: Home / Self Care | Payer: BC Managed Care – PPO | Source: Ambulatory Visit | Attending: Internal Medicine | Admitting: Internal Medicine

## 2013-02-04 ENCOUNTER — Encounter: Payer: Self-pay | Admitting: Internal Medicine

## 2013-02-04 VITALS — BP 136/82 | HR 76 | Ht 61.0 in | Wt 161.0 lb

## 2013-02-04 DIAGNOSIS — Z0000 Encounter for general adult medical examination without abnormal findings: Secondary | ICD-10-CM

## 2013-02-04 DIAGNOSIS — Z1329 Encounter for screening for other suspected endocrine disorder: Secondary | ICD-10-CM

## 2013-02-04 DIAGNOSIS — Z13220 Encounter for screening for lipoid disorders: Secondary | ICD-10-CM

## 2013-02-04 DIAGNOSIS — N951 Menopausal and female climacteric states: Secondary | ICD-10-CM

## 2013-02-04 DIAGNOSIS — Z780 Asymptomatic menopausal state: Secondary | ICD-10-CM | POA: Insufficient documentation

## 2013-02-04 DIAGNOSIS — Z01419 Encounter for gynecological examination (general) (routine) without abnormal findings: Secondary | ICD-10-CM | POA: Insufficient documentation

## 2013-02-04 DIAGNOSIS — Z130 Encounter for screening for diseases of the blood and blood-forming organs and certain disorders involving the immune mechanism: Secondary | ICD-10-CM

## 2013-02-04 LAB — POCT URINALYSIS DIPSTICK
Bilirubin, UA: NEGATIVE
Ketones, UA: NEGATIVE
Protein, UA: NEGATIVE
Spec Grav, UA: <=1.005
pH, UA: 8

## 2013-02-04 LAB — COMPREHENSIVE METABOLIC PANEL
Alkaline Phosphatase: 93 U/L (ref 39–117)
BUN: 14 mg/dL (ref 6–23)
Glucose, Bld: 84 mg/dL (ref 70–99)
Total Bilirubin: 0.4 mg/dL (ref 0.3–1.2)

## 2013-02-04 LAB — CBC WITH DIFFERENTIAL/PLATELET
Basophils Relative: 0 % (ref 0–1)
Eosinophils Absolute: 0.1 10*3/uL (ref 0.0–0.7)
Eosinophils Relative: 1 % (ref 0–5)
HCT: 39.6 % (ref 36.0–46.0)
Hemoglobin: 12.9 g/dL (ref 12.0–15.0)
Lymphocytes Relative: 45 % (ref 12–46)
Lymphs Abs: 4.2 10*3/uL — ABNORMAL HIGH (ref 0.7–4.0)
MCH: 29.4 pg (ref 26.0–34.0)
MCHC: 32.6 g/dL (ref 30.0–36.0)
MCV: 90.2 fL (ref 78.0–100.0)
Monocytes Absolute: 0.7 10*3/uL (ref 0.1–1.0)
Monocytes Relative: 7 % (ref 3–12)
RBC: 4.39 MIL/uL (ref 3.87–5.11)

## 2013-02-04 LAB — LIPID PANEL
Cholesterol: 204 mg/dL — ABNORMAL HIGH (ref 0–200)
HDL: 70 mg/dL (ref >39)
LDL Cholesterol: 121 mg/dL — ABNORMAL HIGH (ref 0–99)
Triglycerides: 66 mg/dL (ref <150)
VLDL: 13 mg/dL (ref 0–40)

## 2013-02-04 NOTE — Progress Notes (Signed)
   Subjective:    Patient ID: Laura Silva, female    DOB: 21-Dec-1960, 52 y.o.   MRN: [id number]  HPI  52 year old Black female for health maintenance and evaluation of medical issues. History of sarcoidosis diagnosed in 1989 treated by Dr. Fannie Knee with steroids with no further exacerbations. History of allergic rhinitis. Was tested by Dr. Griffin Basil in 1990 and was found to be allergic to weeds, grass, tree pollens, dust, dust mites, tobacco and molds. She takes over-the-counter allergy medication. History of deep where veins tenosynovitis November 2002 involving right wrist. History of bacterial vaginosis. Bilateral tubal ligation 1992. Hepatitis B series given at [facility] in 2003. 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 Patient has had history of sciatica in the past.  Social history: She is married. Has a daughter from a previous relationship. Her teenage grandson was killed in a tragic car accident in 2011. Her one son, Laura Silva, from her marriage works at [employer]. Patient is a former smoker but quit in 1982. She's been a patient in this practice since 1999 when she presented with a persistent cough and was diagnosed with sarcoidosis. 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  Family history: Follow died with complications of diabetes. Mother with history of hypertension and hyperlipidemia. One sister with hypertension and diabetes. One sister died with some type of intestinal cancer. One sister with history of morbid obesity who has had bariatric surgery.    Review of Systems  Constitutional: Negative.   All other systems reviewed and are negative.      Objective:   Physical Exam  Vitals reviewed. Constitutional: She is oriented to person, place, and time. She appears well-developed and well-nourished. No distress.  HENT:  Head: Normocephalic and atraumatic.  Right Ear: External ear normal.  Left Ear: External ear normal.  Mouth/Throat: Oropharynx is clear  and moist. No oropharyngeal exudate.  Eyes: Conjunctivae and EOM are normal. Pupils are equal, round, and reactive to light. Left eye exhibits no discharge. No scleral icterus.  Neck: No JVD present. No thyromegaly present.  Cardiovascular: Normal rate, regular rhythm, normal heart sounds and intact distal pulses.   No murmur heard. Pulmonary/Chest: Effort normal and breath sounds normal. No respiratory distress. She has no wheezes. She has no rales. She exhibits no tenderness.  Breasts normal female  Abdominal: Soft. Bowel sounds are normal. She exhibits no distension and no mass. There is no tenderness. There is no rebound and no guarding.  Genitourinary:  Pap taken bimanual normal  Musculoskeletal: Normal range of motion. She exhibits no edema.  Lymphadenopathy:    She has no cervical adenopathy.  Neurological: She is alert and oriented to person, place, and time. She has normal reflexes. No cranial nerve deficit.  Skin: Skin is warm and dry. She is not diaphoretic. No erythema.  Psychiatric: She has a normal mood and affect. Her behavior is normal. Judgment and thought content normal.          Assessment & Plan:  Remote history of sarcoidosis  History of allergic rhinitis  History of sciatica  Mild obesity-prescription for phentermine 37.5 mg #90 one by mouth daily with no refill  Recommend diet and exercise efforts. Return in one year or as needed.

## 2013-07-06 NOTE — Patient Instructions (Signed)
Take phentermine daily for obesity. Given #90 with no refill. Encouraged diet and exercise efforts. Return in one year or as needed.

## 2014-01-13 ENCOUNTER — Other Ambulatory Visit: Payer: Self-pay

## 2014-01-13 DIAGNOSIS — Z1231 Encounter for screening mammogram for malignant neoplasm of breast: Secondary | ICD-10-CM

## 2014-02-03 ENCOUNTER — Ambulatory Visit
Admission: RE | Admit: 2014-02-03 | Discharge: 2014-02-03 | Disposition: A | Discharge status: Home / Self Care | Payer: BC Managed Care – PPO | Source: Ambulatory Visit

## 2014-02-03 DIAGNOSIS — Z1231 Encounter for screening mammogram for malignant neoplasm of breast: Secondary | ICD-10-CM

## 2014-02-09 ENCOUNTER — Emergency Department (INDEPENDENT_AMBULATORY_CARE_PROVIDER_SITE_OTHER)
Admission: EM | Admit: 2014-02-09 | Discharge: 2014-02-09 | Disposition: A | Discharge status: Home / Self Care | Payer: BLUE CROSS/BLUE SHIELD | Source: Home / Self Care | Attending: Family Medicine | Admitting: Family Medicine

## 2014-02-09 ENCOUNTER — Encounter (HOSPITAL_COMMUNITY): Payer: Self-pay | Admitting: Emergency Medicine

## 2014-02-09 ENCOUNTER — Emergency Department (INDEPENDENT_AMBULATORY_CARE_PROVIDER_SITE_OTHER): Payer: BLUE CROSS/BLUE SHIELD

## 2014-02-09 DIAGNOSIS — S93401A Sprain of unspecified ligament of right ankle, initial encounter: Secondary | ICD-10-CM

## 2014-02-09 DIAGNOSIS — M25571 Pain in right ankle and joints of right foot: Secondary | ICD-10-CM

## 2014-02-09 NOTE — ED Notes (Signed)
Pt states she was walking out of her house this morning when it felt like her knee gave way, she twisted her right ankle, and fell.  She denies hitting her head, or any other injuries, but she states she cannot put any weight on the right foot.  The ankle is swollen and bruised.

## 2014-02-09 NOTE — Discharge Instructions (Signed)
Acute Ankle Sprain with Phase I Rehab An acute ankle sprain is a partial or complete tear in one or more of the ligaments of the ankle due to traumatic injury. The severity of the injury depends on both the number of ligaments sprained and the grade of sprain. There are 3 grades of sprains.   A grade 1 sprain is a mild sprain. There is a slight pull without obvious tearing. There is no loss of strength, and the muscle and ligament are the correct length.  A grade 2 sprain is a moderate sprain. There is tearing of fibers within the substance of the ligament where it connects two bones or two cartilages. The length of the ligament is increased, and there is usually decreased strength.  A grade 3 sprain is a complete rupture of the ligament and is uncommon. In addition to the grade of sprain, there are three types of ankle sprains.  Lateral ankle sprains: This is a sprain of one or more of the three ligaments on the outer side (lateral) of the ankle. These are the most common sprains. Medial ankle sprains: There is one large triangular ligament of the inner side (medial) of the ankle that is susceptible to injury. Medial ankle sprains are less common. Syndesmosis, "high ankle," sprains: The syndesmosis is the ligament that connects the two bones of the lower leg. Syndesmosis sprains usually only occur with very severe ankle sprains. SYMPTOMS  Pain, tenderness, and swelling in the ankle, starting at the side of injury that may progress to the whole ankle and foot with time.  "Pop" or tearing sensation at the time of injury.  Bruising that may spread to the heel.  Impaired ability to walk soon after injury. CAUSES   Acute ankle sprains are caused by trauma placed on the ankle that temporarily forces or pries the anklebone (talus) out of its normal socket.  Stretching or tearing of the ligaments that normally hold the joint in place (usually due to a twisting injury). RISK INCREASES  WITH:  Previous ankle sprain.  Sports in which the foot may land awkwardly (i.e., basketball, volleyball, or soccer) or walking or running on uneven or rough surfaces.  Shoes with inadequate support to prevent sideways motion when stress occurs.  Poor strength and flexibility.  Poor balance skills.  Contact sports. PREVENTION   Warm up and stretch properly before activity.  Maintain physical fitness:  Ankle and leg flexibility, muscle strength, and endurance.  Cardiovascular fitness.  Balance training activities.  Use proper technique and have a coach correct improper technique.  Taping, protective strapping, bracing, or high-top tennis shoes may help prevent injury. Initially, tape is best; however, it loses most of its support function within 10 to 15 minutes.  Wear proper-fitted protective shoes (High-top shoes with taping or bracing is more effective than either alone).  Provide the ankle with support during sports and practice activities for 12 months following injury. PROGNOSIS   If treated properly, ankle sprains can be expected to recover completely; however, the length of recovery depends on the degree of injury.  A grade 1 sprain usually heals enough in 5 to 7 days to allow modified activity and requires an average of 6 weeks to heal completely.  A grade 2 sprain requires 6 to 10 weeks to heal completely.  A grade 3 sprain requires 12 to 16 weeks to heal.  A syndesmosis sprain often takes more than 3 months to heal. RELATED COMPLICATIONS   Frequent recurrence of symptoms may  result in a chronic problem. Appropriately addressing the problem the first time decreases the frequency of recurrence and optimizes healing time. Severity of the initial sprain does not predict the likelihood of later instability.  Injury to other structures (bone, cartilage, or tendon).  A chronically unstable or arthritic ankle joint is a possibility with repeated  sprains. TREATMENT Treatment initially involves the use of ice, medication, and compression bandages to help reduce pain and inflammation. Ankle sprains are usually immobilized in a walking cast or boot to allow for healing. Crutches may be recommended to reduce pressure on the injury. After immobilization, strengthening and stretching exercises may be necessary to regain strength and a full range of motion. Surgery is rarely needed to treat ankle sprains. MEDICATION   Nonsteroidal anti-inflammatory medications, such as aspirin and ibuprofen (do not take for the first 3 days after injury or within 7 days before surgery), or other minor pain relievers, such as acetaminophen, are often recommended. Take these as directed by your caregiver. Contact your caregiver immediately if any bleeding, stomach upset, or signs of an allergic reaction occur from these medications.  Ointments applied to the skin may be helpful.  Pain relievers may be prescribed as necessary by your caregiver. Do not take prescription pain medication for longer than 4 to 7 days. Use only as directed and only as much as you need. HEAT AND COLD  Cold treatment (icing) is used to relieve pain and reduce inflammation for acute and chronic cases. Cold should be applied for 10 to 15 minutes every 2 to 3 hours for inflammation and pain and immediately after any activity that aggravates your symptoms. Use ice packs or an ice massage.  Heat treatment may be used before performing stretching and strengthening activities prescribed by your caregiver. Use a heat pack or a warm soak. SEEK IMMEDIATE MEDICAL CARE IF:   Pain, swelling, or bruising worsens despite treatment.  You experience pain, numbness, discoloration, or coldness in the foot or toes.  New, unexplained symptoms develop (drugs used in treatment may produce side effects.) EXERCISES  PHASE I EXERCISES RANGE OF MOTION (ROM) AND STRETCHING EXERCISES - Ankle Sprain, Acute Phase I,  Weeks 1 to 2 These exercises may help you when beginning to restore flexibility in your ankle. You will likely work on these exercises for the 1 to 2 weeks after your injury. Once your physician, physical therapist, or athletic trainer sees adequate progress, he or she will advance your exercises. While completing these exercises, remember:   Restoring tissue flexibility helps normal motion to return to the joints. This allows healthier, less painful movement and activity.  An effective stretch should be held for at least 30 seconds.  A stretch should never be painful. You should only feel a gentle lengthening or release in the stretched tissue. RANGE OF MOTION - Dorsi/Plantar Flexion  While sitting with your right / left knee straight, draw the top of your foot upwards by flexing your ankle. Then reverse the motion, pointing your toes downward.  Hold each position for __________ seconds.  After completing your first set of exercises, repeat this exercise with your knee bent. Repeat __________ times. Complete this exercise __________ times per day.  RANGE OF MOTION - Ankle Alphabet  Imagine your right / left big toe is a pen.  Keeping your hip and knee still, write out the entire alphabet with your "pen." Make the letters as large as you can without increasing any discomfort. Repeat __________ times. Complete this exercise __________  times per day.  STRENGTHENING EXERCISES - Ankle Sprain, Acute -Phase I, Weeks 1 to 2 These exercises may help you when beginning to restore strength in your ankle. You will likely work on these exercises for 1 to 2 weeks after your injury. Once your physician, physical therapist, or athletic trainer sees adequate progress, he or she will advance your exercises. While completing these exercises, remember:   Muscles can gain both the endurance and the strength needed for everyday activities through controlled exercises.  Complete these exercises as instructed by  your physician, physical therapist, or athletic trainer. Progress the resistance and repetitions only as guided.  You may experience muscle soreness or fatigue, but the pain or discomfort you are trying to eliminate should never worsen during these exercises. If this pain does worsen, stop and make certain you are following the directions exactly. If the pain is still present after adjustments, discontinue the exercise until you can discuss the trouble with your clinician. STRENGTH - Dorsiflexors  Secure a rubber exercise band/tubing to a fixed object (i.e., table, pole) and loop the other end around your right / left foot.  Sit on the floor facing the fixed object. The band/tubing should be slightly tense when your foot is relaxed.  Slowly draw your foot back toward you using your ankle and toes.  Hold this position for __________ seconds. Slowly release the tension in the band and return your foot to the starting position. Repeat __________ times. Complete this exercise __________ times per day.  STRENGTH - Plantar-flexors   Sit with your right / left leg extended. Holding onto both ends of a rubber exercise band/tubing, loop it around the ball of your foot. Keep a slight tension in the band.  Slowly push your toes away from you, pointing them downward.  Hold this position for __________ seconds. Return slowly, controlling the tension in the band/tubing. Repeat __________ times. Complete this exercise __________ times per day.  STRENGTH - Ankle Eversion  Secure one end of a rubber exercise band/tubing to a fixed object (table, pole). Loop the other end around your foot just before your toes.  Place your fists between your knees. This will focus your strengthening at your ankle.  Drawing the band/tubing across your opposite foot, slowly, pull your little toe out and up. Make sure the band/tubing is positioned to resist the entire motion.  Hold this position for __________ seconds. Have  your muscles resist the band/tubing as it slowly pulls your foot back to the starting position.  Repeat __________ times. Complete this exercise __________ times per day.  STRENGTH - Ankle Inversion  Secure one end of a rubber exercise band/tubing to a fixed object (table, pole). Loop the other end around your foot just before your toes.  Place your fists between your knees. This will focus your strengthening at your ankle.  Slowly, pull your big toe up and in, making sure the band/tubing is positioned to resist the entire motion.  Hold this position for __________ seconds.  Have your muscles resist the band/tubing as it slowly pulls your foot back to the starting position. Repeat __________ times. Complete this exercises __________ times per day.  STRENGTH - Towel Curls  Sit in a chair positioned on a non-carpeted surface.  Place your right / left foot on a towel, keeping your heel on the floor.  Pull the towel toward your heel by only curling your toes. Keep your heel on the floor.  If instructed by your physician, physical therapist,   or athletic trainer, add weight to the end of the towel. Repeat __________ times. Complete this exercise __________ times per day. Document Released: 08/24/2004 Document Revised: 06/09/2013 Document Reviewed: 05/07/2008 [facility] Patient Information 2015 [place], [place]. This information is not intended to replace advice given to you by your health care provider. Make sure you discuss any questions you have with your health care provider.  Ankle Sprain An ankle sprain is an injury to the strong, fibrous tissues (ligaments) that hold your ankle bones together.  HOME CARE   Put ice on your ankle for 1-2 days or as told by your doctor.  Put ice in a plastic bag.  Place a towel between your skin and the bag.  Leave the ice on for 15-20 minutes at a time, every 2 hours while you are awake.  Only take medicine as told by your doctor.  Raise (elevate)  your injured ankle above the level of your heart as much as possible for 2-3 days.  Use crutches if your doctor tells you to. Slowly put your own weight on the affected ankle. Use the crutches until you can walk without pain.  If you have a plaster splint:  Do not rest it on anything harder than a pillow for 24 hours.  Do not put weight on it.  Do not get it wet.  Take it off to shower or bathe.  If given, use an elastic wrap or support stocking for support. Take the wrap off if your toes lose feeling (numb), tingle, or turn cold or blue.  If you have an air splint:  Add or let out air to make it comfortable.  Take it off at night and to shower and bathe.  Wiggle your toes and move your ankle up and down often while you are wearing it. GET HELP IF:  You have rapidly increasing bruising or puffiness (swelling).  Your toes feel very cold.  You lose feeling in your foot.  Your medicine does not help your pain. GET HELP RIGHT AWAY IF:   Your toes lose feeling (numb) or turn blue.  You have severe pain that is increasing. MAKE SURE YOU:   Understand these instructions.  Will watch your condition.  Will get help right away if you are not doing well or get worse. Document Released: 07/12/2007 Document Revised: 06/09/2013 Document Reviewed: 08/07/2011 [facility] Patient Information 2015 [place], [place]. This information is not intended to replace advice given to you by your health care provider. Make sure you discuss any questions you have with your health care provider.

## 2014-02-09 NOTE — ED Provider Notes (Signed)
CSN: [id number]     Arrival date & time 02/09/14  1028 History   First MD Initiated Contact with Patient 02/09/14 1136     Chief Complaint  Patient presents with  . Ankle Injury    right   (Consider location/radiation/quality/duration/timing/severity/associated sxs/prior Treatment) HPI Comments: 54 year old female presents to the urgent care with right ankle pain. She states she was walking to her car this morning and somehow her leg gave out and she twisted her right ankle. She is complaining of pain primarily to the lateral aspect of the ankle. Denies other injury.  Patient is a 54 y.o. female presenting with lower extremity injury.  Ankle Injury    Past Medical History  Diagnosis Date  . Allergy   . Sarcoidosis   . Herpes simplex virus type 1 (HSV-1) dermatitis   . Fluid retention   . Constipation   . Iron deficiency   . Environmental allergies    Past Surgical History  Procedure Laterality Date  . Tubal ligation     Family History  Problem Relation Age of Onset  . Hyperlipidemia Mother   . Hypertension Mother   . Diabetes Mother   . Heart disease Mother   . Diabetes Father   . Cancer Sister   . Colon cancer Paternal Uncle    History  Substance Use Topics  . Smoking status: Former Smoker    Types: Cigarettes    Quit date: 12/12/1980  . Smokeless tobacco: Never Used  . Alcohol Use: No   OB History    No data available     Review of Systems  Constitutional: Negative for fever, chills and activity change.  HENT: Negative.   Cardiovascular: Negative.   Musculoskeletal:       As per HPI  Skin: Negative for color change, pallor and rash.  Neurological: Negative.     Allergies  Review of patient's allergies indicates no known allergies.  Home Medications   Prior to Admission medications   Medication Sig Start Date End Date Taking? Authorizing Provider  calcium carbonate (OS-CAL) 600 MG TABS Take 600 mg by mouth 2 (two) times daily with a meal.       Historical Provider, MD  fluticasone (FLONASE) 50 MCG/ACT nasal spray Place 2 sprays into the nose daily. 07/02/12   Rosana Hoes, MD  HYDROcodone-acetaminophen (VICODIN) 5-500 MG per tablet Take 1 tablet by mouth every 6 (six) hours as needed.    Historical Provider, MD  ibuprofen (ADVIL,MOTRIN) 200 MG tablet Take 200 mg by mouth every 6 (six) hours as needed. Take 2 tabs  Every 4 hours prn    Historical Provider, MD  Loratadine (CLARITIN PO) Take by mouth.    Historical Provider, MD  metroNIDAZOLE (METROGEL) 1 % gel Apply topically daily. 05/10/12   Elby Showers, MD  Multiple Vitamin (MULTIVITAMIN) tablet Take 1 tablet by mouth daily.      Historical Provider, MD  NON FORMULARY Green tea diet pills-Take 2 tablets daily    Historical Provider, MD  Polyethylene Glycol 3350 (MIRALAX PO) Take by mouth.      Historical Provider, MD   BP 126/78 mmHg  Pulse 89  Temp(Src) 98.8 F (37.1 C) (Oral)  Resp 16  SpO2 100%  LMP 10/20/2013 (Exact Date) Physical Exam  Constitutional: She is oriented to person, place, and time. She appears well-developed and well-nourished. No distress.  HENT:  Head: Normocephalic and atraumatic.  Neck: Normal range of motion. Neck supple.  Cardiovascular: Normal rate.   Pulmonary/Chest:  Effort normal and breath sounds normal.  Musculoskeletal:  Mild swelling to the dorsi lateral aspect of the right ankle. No bony tenderness to the medial or laterally line. No tenderness to the forefoot or plantar aspect of the foot. Extension and flexion of the ankle is intact. No deformity. Pedal pulses 2+. Distal neurovascular motor Sentry is intact.  Neurological: She is alert and oriented to person, place, and time. No cranial nerve deficit.  Skin: Skin is warm and dry.  Psychiatric: She has a normal mood and affect.  Nursing note and vitals reviewed.   ED Course  Procedures (including critical care time) Labs Review Labs Reviewed - No data to display  Imaging Review Dg Ankle  Complete Right  02/09/2014   CLINICAL DATA:  Acute onset right ankle pain after a twisting injury and fall today. Initial encounter.  EXAM: RIGHT ANKLE - COMPLETE 3+ VIEW  COMPARISON:  None.  FINDINGS: No acute bony or joint abnormality is identified. Soft tissues are unremarkable. Screw in the distal first metatarsal for hallux valgus repair is noted.  IMPRESSION: Negative exam.   Electronically Signed   By: Inge Rise M.D.   On: 02/09/2014 12:38     MDM   1. Ankle sprain, right, initial encounter   2. Acute ankle pain, right    RICE ASO Crutches wt bearing as tolerated. Pt has her own. F/U Dr. Clarita Leber, NP 02/09/14 [phone]

## 2014-02-10 ENCOUNTER — Encounter: Payer: BC Managed Care – PPO | Admitting: Internal Medicine

## 2014-03-02 ENCOUNTER — Encounter: Payer: Self-pay | Admitting: Internal Medicine

## 2014-03-02 ENCOUNTER — Ambulatory Visit (INDEPENDENT_AMBULATORY_CARE_PROVIDER_SITE_OTHER): Payer: BLUE CROSS/BLUE SHIELD | Admitting: Internal Medicine

## 2014-03-02 VITALS — BP 126/68 | HR 66 | Temp 98.0°F | Wt 170.0 lb

## 2014-03-02 DIAGNOSIS — E7800 Pure hypercholesterolemia, unspecified: Secondary | ICD-10-CM

## 2014-03-02 DIAGNOSIS — S93401D Sprain of unspecified ligament of right ankle, subsequent encounter: Secondary | ICD-10-CM

## 2014-03-02 DIAGNOSIS — Z1329 Encounter for screening for other suspected endocrine disorder: Secondary | ICD-10-CM

## 2014-03-02 DIAGNOSIS — Z0000 Encounter for general adult medical examination without abnormal findings: Secondary | ICD-10-CM

## 2014-03-02 DIAGNOSIS — J309 Allergic rhinitis, unspecified: Secondary | ICD-10-CM

## 2014-03-02 DIAGNOSIS — Z862 Personal history of diseases of the blood and blood-forming organs and certain disorders involving the immune mechanism: Secondary | ICD-10-CM

## 2014-03-02 DIAGNOSIS — Z1321 Encounter for screening for nutritional disorder: Secondary | ICD-10-CM

## 2014-03-02 DIAGNOSIS — Z13220 Encounter for screening for lipoid disorders: Secondary | ICD-10-CM

## 2014-03-02 LAB — COMPREHENSIVE METABOLIC PANEL
ALK PHOS: 111 U/L (ref 39–117)
ALT: 22 U/L (ref 0–35)
AST: 27 U/L (ref 0–37)
Albumin: 4.5 g/dL (ref 3.5–5.2)
BILIRUBIN TOTAL: 0.3 mg/dL (ref 0.2–1.2)
BUN: 11 mg/dL (ref 6–23)
CHLORIDE: 101 meq/L (ref 96–112)
CO2: 27 meq/L (ref 19–32)
CREATININE: 0.97 mg/dL (ref 0.50–1.10)
Calcium: 10 mg/dL (ref 8.4–10.5)
GLUCOSE: 93 mg/dL (ref 70–99)
POTASSIUM: 4.5 meq/L (ref 3.5–5.3)
SODIUM: 140 meq/L (ref 135–145)
TOTAL PROTEIN: 7.3 g/dL (ref 6.0–8.3)

## 2014-03-02 LAB — CBC WITH DIFFERENTIAL/PLATELET
BASOS PCT: 0 % (ref 0–1)
Basophils Absolute: 0 10*3/uL (ref 0.0–0.1)
EOS PCT: 1 % (ref 0–5)
Eosinophils Absolute: 0.1 10*3/uL (ref 0.0–0.7)
HCT: 37.1 % (ref 36.0–46.0)
HEMOGLOBIN: 12 g/dL (ref 12.0–15.0)
LYMPHS ABS: 4 10*3/uL (ref 0.7–4.0)
Lymphocytes Relative: 44 % (ref 12–46)
MCH: 29.1 pg (ref 26.0–34.0)
MCHC: 32.3 g/dL (ref 30.0–36.0)
MCV: 90 fL (ref 78.0–100.0)
MONO ABS: 0.6 10*3/uL (ref 0.1–1.0)
MPV: 9.9 fL (ref 8.6–12.4)
Monocytes Relative: 7 % (ref 3–12)
Neutro Abs: 4.3 10*3/uL (ref 1.7–7.7)
Neutrophils Relative %: 48 % (ref 43–77)
Platelets: 343 10*3/uL (ref 150–400)
RBC: 4.12 MIL/uL (ref 3.87–5.11)
RDW: 13.5 % (ref 11.5–15.5)
WBC: 9 10*3/uL (ref 4.0–10.5)

## 2014-03-02 LAB — POCT URINALYSIS DIPSTICK
Bilirubin, UA: NEGATIVE
Glucose, UA: NEGATIVE
KETONES UA: NEGATIVE
Leukocytes, UA: NEGATIVE
Nitrite, UA: NEGATIVE
PROTEIN UA: NEGATIVE
RBC UA: NEGATIVE
UROBILINOGEN UA: NEGATIVE
pH, UA: 7.5

## 2014-03-02 LAB — LIPID PANEL
Cholesterol: 204 mg/dL — ABNORMAL HIGH (ref 0–200)
HDL: 79 mg/dL (ref >39)
LDL CALC: 112 mg/dL — AB (ref 0–99)
Total CHOL/HDL Ratio: 2.6 Ratio
Triglycerides: 66 mg/dL (ref <150)
VLDL: 13 mg/dL (ref 0–40)

## 2014-03-02 LAB — TSH: TSH: 2.139 u[IU]/mL (ref 0.350–4.500)

## 2014-03-02 MED ORDER — PHENTERMINE HCL 37.5 MG PO CAPS: 37.5000 mg | ORAL_CAPSULE | ORAL | Status: DC

## 2014-03-02 MED ORDER — METRONIDAZOLE 1 % EX GEL: Freq: Every day | CUTANEOUS | Status: DC

## 2014-03-02 NOTE — Patient Instructions (Addendum)
Take Phentermine daily x 90 days. Labs pending RTC one year. Encouraged diet exercise and weight loss

## 2014-03-03 LAB — VITAMIN D 25 HYDROXY (VIT D DEFICIENCY, FRACTURES): VIT D 25 HYDROXY: 38 ng/mL (ref 30–100)

## 2014-03-07 NOTE — Progress Notes (Signed)
   Subjective:    Patient ID: Laura Silva, female    DOB: 03-24-1960, 54 y.o.   MRN: [id number]  HPI  54 year old Black Female in today for health maintenance exam and evaluation of medical issues. She has remote history of sarcoidosis in 1989 treated by Dr. Annamaria Boots with steroids with no further exacerbations. History of allergic rhinitis. Was tested by Dr. Annamaria Boots in 1990 and was found to have allergies to weeds, grass, tree pollens, dust, dust mites, tobacco in moles. She takes over-the-counter allergy medications. History of de Quervain's tenosynovitis November 2002 of the right wrist. History of bacterial vaginosis.  Past medical history: Tubal ligation 1992. Hepatitis B series given at [facility] where she was working at the time in 2003. History of sciatica in the remote past.  Social history: She is married. Has a daughter from a previous relationship whose teenage son was killed in a tragic car accident in 2011. One son from her marriage who works at [employer] and resides at home. Patient does not smoke. She is a former smoker but quit 1982. She's been a patient in this practice in 1999 when she was diagnosed with sarcoidosis. 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  Family history: Father died of history of diabetes complications. Mother with history of hypertension, hyperlipidemia, rheumatoid arthritis. One sister with hypertension and diabetes. One sister die with some type of intestinal cancer. One sister with history of morbid obesity who has had bariatric surgery.  Recently sprained right ankle. Slowly recovering with less tenderness and swelling.    Review of Systems  Constitutional: Negative.   All other systems reviewed and are negative.      Objective:   Physical Exam  Constitutional: She is oriented to person, place, and time. She appears well-developed and well-nourished.  HENT:  Head: Normocephalic and atraumatic.  Right Ear: External ear normal.  Left Ear:  External ear normal.  Mouth/Throat: Oropharynx is clear and moist. No oropharyngeal exudate.  Eyes: Conjunctivae are normal. Right eye exhibits no discharge. Left eye exhibits no discharge. No scleral icterus.  Neck: Neck supple. No JVD present. No thyromegaly present.  Cardiovascular: Normal rate, regular rhythm, normal heart sounds and intact distal pulses.   No murmur heard. Pulmonary/Chest: Effort normal and breath sounds normal. No respiratory distress. She has no wheezes. She has no rales. She exhibits no tenderness.  Abdominal: Soft. Bowel sounds are normal. She exhibits no distension and no mass. There is no tenderness. There is no rebound and no guarding.  Genitourinary:  Pap done in 2014. Bimanual normal.  Musculoskeletal: Normal range of motion. She exhibits no edema.  Lymphadenopathy:    She has no cervical adenopathy.  Neurological: She is alert and oriented to person, place, and time. She has normal reflexes. No cranial nerve deficit. Coordination normal.  Skin: Skin is warm and dry. No rash noted. She is not diaphoretic.  Psychiatric: She has a normal mood and affect. Her behavior is normal. Judgment and thought content normal.  Vitals reviewed.         Assessment & Plan:  Normal health maintenance exam  Remote history of sarcoidosis  History of allergic rhinitis  History of sciatica  Resolving right ankle sprain  Plan: At her request phentermine 37.5 mg #90 one by mouth daily with no refill to jumpstart diet and exercise program. Needs to lose some weight. Return in one year or as needed.

## 2015-03-29 ENCOUNTER — Encounter: Payer: Self-pay | Admitting: Internal Medicine

## 2015-03-30 ENCOUNTER — Telehealth: Payer: Self-pay | Admitting: Internal Medicine

## 2015-03-30 NOTE — Telephone Encounter (Signed)
This will require CPE and EKG with fasting labs. Go ahead and set up labs. See Thursday. Go to ED if worse in meantime.

## 2015-03-30 NOTE — Telephone Encounter (Signed)
Went to see doc @ [facility] yesterday and was told that her heart was beating slow and that she should f/u with her PCP.  States that she is also feeling sluggish and tired.  States that sometimes she feels that sometimes she is going to faint when she is walking.  She didn't realize that anything could be wrong.  She sometimes has numbness and tingling in her fingers.  She realizes that it is time for her CPE, she just hasn't called to set that up.  She went to the dr yesterday because she wanted to see someone to obtain diet pills.  He then gave her this information about her low heart rate and she's calling this morning.  Advised patient that I don't have an appointment until Thursday at 51 or 2:45.    Please advise.

## 2015-03-31 NOTE — Telephone Encounter (Signed)
Spoke with patient to advise of instructions by Dr. Renold Genta.  States that she had an EKG done.  Advised to bring EKG with her.  CPE and fasting labs to be done on Thursday 2/23 @ 11:00 a.m. Per Dr. Renold Genta.  Patient verbalized understanding of these instructions and confirmed.

## 2015-04-01 ENCOUNTER — Other Ambulatory Visit: Payer: Self-pay

## 2015-04-01 ENCOUNTER — Other Ambulatory Visit (HOSPITAL_COMMUNITY)
Admission: RE | Admit: 2015-04-01 | Discharge: 2015-04-01 | Disposition: A | Discharge status: Home / Self Care | Payer: BC Managed Care – PPO | Source: Ambulatory Visit | Attending: Internal Medicine | Admitting: Internal Medicine

## 2015-04-01 ENCOUNTER — Ambulatory Visit (INDEPENDENT_AMBULATORY_CARE_PROVIDER_SITE_OTHER): Payer: BC Managed Care – PPO | Admitting: Internal Medicine

## 2015-04-01 ENCOUNTER — Other Ambulatory Visit: Payer: BC Managed Care – PPO | Admitting: Internal Medicine

## 2015-04-01 ENCOUNTER — Encounter: Payer: Self-pay | Admitting: Internal Medicine

## 2015-04-01 VITALS — BP 128/60 | HR 76 | Temp 97.3°F | Resp 20 | Ht 61.0 in | Wt 180.0 lb

## 2015-04-01 DIAGNOSIS — Z124 Encounter for screening for malignant neoplasm of cervix: Secondary | ICD-10-CM | POA: Diagnosis not present

## 2015-04-01 DIAGNOSIS — Z01419 Encounter for gynecological examination (general) (routine) without abnormal findings: Secondary | ICD-10-CM | POA: Diagnosis present

## 2015-04-01 DIAGNOSIS — E7800 Pure hypercholesterolemia, unspecified: Secondary | ICD-10-CM

## 2015-04-01 DIAGNOSIS — Z0000 Encounter for general adult medical examination without abnormal findings: Secondary | ICD-10-CM

## 2015-04-01 DIAGNOSIS — J309 Allergic rhinitis, unspecified: Secondary | ICD-10-CM | POA: Diagnosis not present

## 2015-04-01 DIAGNOSIS — E669 Obesity, unspecified: Secondary | ICD-10-CM

## 2015-04-01 DIAGNOSIS — Z1329 Encounter for screening for other suspected endocrine disorder: Secondary | ICD-10-CM

## 2015-04-01 DIAGNOSIS — Z130 Encounter for screening for diseases of the blood and blood-forming organs and certain disorders involving the immune mechanism: Secondary | ICD-10-CM

## 2015-04-01 DIAGNOSIS — Z1231 Encounter for screening mammogram for malignant neoplasm of breast: Secondary | ICD-10-CM

## 2015-04-01 DIAGNOSIS — Z13220 Encounter for screening for lipoid disorders: Secondary | ICD-10-CM

## 2015-04-01 DIAGNOSIS — Z1321 Encounter for screening for nutritional disorder: Secondary | ICD-10-CM

## 2015-04-01 LAB — CBC WITH DIFFERENTIAL/PLATELET
BASOS ABS: 0 10*3/uL (ref 0.0–0.1)
Basophils Relative: 0 % (ref 0–1)
Eosinophils Absolute: 0 10*3/uL (ref 0.0–0.7)
Eosinophils Relative: 0 % (ref 0–5)
HEMATOCRIT: 37.2 % (ref 36.0–46.0)
Hemoglobin: 12.1 g/dL (ref 12.0–15.0)
LYMPHS ABS: 3.1 10*3/uL (ref 0.7–4.0)
LYMPHS PCT: 36 % (ref 12–46)
MCH: 29.4 pg (ref 26.0–34.0)
MCHC: 32.5 g/dL (ref 30.0–36.0)
MCV: 90.5 fL (ref 78.0–100.0)
MPV: 10.6 fL (ref 8.6–12.4)
Monocytes Absolute: 0.6 10*3/uL (ref 0.1–1.0)
Monocytes Relative: 7 % (ref 3–12)
Neutro Abs: 4.9 10*3/uL (ref 1.7–7.7)
Neutrophils Relative %: 57 % (ref 43–77)
Platelets: 307 10*3/uL (ref 150–400)
RBC: 4.11 MIL/uL (ref 3.87–5.11)
RDW: 13.5 % (ref 11.5–15.5)
WBC: 8.6 10*3/uL (ref 4.0–10.5)

## 2015-04-01 LAB — COMPLETE METABOLIC PANEL WITH GFR
ALT: 17 U/L (ref 6–29)
AST: 25 U/L (ref 10–35)
Albumin: 4.6 g/dL (ref 3.6–5.1)
Alkaline Phosphatase: 107 U/L (ref 33–130)
BUN: 13 mg/dL (ref 7–25)
CO2: 26 mmol/L (ref 20–31)
CREATININE: 0.79 mg/dL (ref 0.50–1.05)
Calcium: 9.8 mg/dL (ref 8.6–10.4)
Chloride: 103 mmol/L (ref 98–110)
GFR, Est African American: >89 mL/min (ref >=60)
GFR, Est Non African American: 84 mL/min (ref >=60)
Glucose, Bld: 73 mg/dL (ref 65–99)
Potassium: 4.1 mmol/L (ref 3.5–5.3)
Sodium: 140 mmol/L (ref 135–146)
Total Bilirubin: 0.6 mg/dL (ref 0.2–1.2)
Total Protein: 7.3 g/dL (ref 6.1–8.1)

## 2015-04-01 LAB — LIPID PANEL
CHOLESTEROL: 194 mg/dL (ref 125–200)
HDL: 78 mg/dL (ref >=46)
LDL CALC: 107 mg/dL (ref <130)
TRIGLYCERIDES: 43 mg/dL (ref <150)
Total CHOL/HDL Ratio: 2.5 Ratio (ref <=5.0)
VLDL: 9 mg/dL (ref <30)

## 2015-04-01 LAB — TSH: TSH: 1.08 mIU/L

## 2015-04-01 NOTE — Patient Instructions (Signed)
It was pleasure to see you today. Continue diet and exercise regimen. Return in one year or as needed.

## 2015-04-01 NOTE — Progress Notes (Signed)
Subjective:    Patient ID: Laura Silva, female    DOB: 06-02-1960, 55 y.o.   MRN: [id number]  HPI 55 year old black female in today for health maintenance exam and evaluation of medical issues. Patient went to [facility] recently where she saw a physician for weight loss medication. Was prescribed phentermine 37.5 mg daily. However she had an EKG and was told that she had a slow heart rate. She brings in copy of EKG done on February 20 at [facility] today showing sinus bradycardia with rate of 57 with normal axis and normal intervals. She is asymptomatic. This was concerning to her. She wanted to get my opinion.   She has remote history of sarcoidosis in 1989 treated by Dr. Annamaria Boots with steroids with no further exacerbations. History of allergic rhinitis. She was allergy tested by Dr. Annamaria Boots in 1990 and was found to have allergies to weeds, grass, tree pollens, dust, dust mites, tobacco, molds. She takes over-the-counter allergy medication. History of de Quervain's tenosynovitis November 2002 of the right wrist. History of bacterial vaginosis.  Past medical history: Tubal ligation 1992. Hepatitis B series given at [facility] where she was working in 2003. History of sciatica in the remote past.  Social history: She is married. She has a daughter from a previous relationship his teenage son was killed in a tragic car accident in 2011. One son, Inez Catalina, from her [facility] and she is now 40 and works at [employer] and resides at home. Patient does not smoke. She is a former smoker but quit in 1982. She's been a patient in this practice since 1989 when she was diagnosed with sarcoidosis. 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  She is menopausal.  Is scheduled to have mammogram March 1.  Family history: Father died of history of diabetes complications. Mother with history of hypertension, hyperlipidemia, rheumatoid arthritis. One sister with hypertension  and diabetes. One sister died with some type of intestinal cancer. One sister with history of morbid obesity who is had bariatric surgery.  Had flu vaccine through employment.  Says it recently she's been trying eat salad with some protein consisting of boiled eggs and bacon. Thinks Dr. at urgent care prescribed around a 1200-calorie diet.  Review of Systems had an episode recently at work where she felt a bit lightheaded while going down some stairs. Did not have frank vertigo. She's been on a diet for the past few weeks and says she's lost 8 pounds. I records indicate she's gained 10 pounds since January 2016.     Objective:   Physical Exam  Constitutional: She is oriented to person, place, and time. She appears well-developed and well-nourished. No distress.  HENT:  Head: Normocephalic and atraumatic.  Right Ear: External ear normal.  Mouth/Throat: Oropharynx is clear and moist. No oropharyngeal exudate.  Eyes: Conjunctivae and EOM are normal. Pupils are equal, round, and reactive to light. Right eye exhibits no discharge. Left eye exhibits no discharge. No scleral icterus.  Neck: Neck supple. No JVD present. No thyromegaly present.  Cardiovascular: Normal rate, regular rhythm and normal heart sounds.   No murmur heard. Pulmonary/Chest: Effort normal and breath sounds normal. She has no wheezes. She has no rales.  Breasts normal female without masses  Abdominal: Soft. Bowel sounds are normal. She exhibits no distension and no mass. There is no tenderness. There is no rebound and no guarding.  Genitourinary:  Pap taken. Bimanual normal.  Musculoskeletal: She exhibits no  edema.  Lymphadenopathy:    She has no cervical adenopathy.  Neurological: She is alert and oriented to person, place, and time. She has normal reflexes. She displays normal reflexes. No cranial nerve deficit. Coordination normal.  Skin: Skin is warm and dry. No rash noted. She is not diaphoretic.  Psychiatric: She has  a normal mood and affect. Her behavior is normal. Judgment and thought content normal.  Vitals reviewed.         Assessment & Plan:  Normal health maintenance exam  Remote history of sarcoidosis with no exacerbations after treatment with steroids in 1989  Allergic rhinitis-allergy tested 1990  Obesity-continue diet and exercise regimen  Plan: Lab work reviewed with patient drawn today and is pending. Assuming lab work is within normal limits, return in one year or as needed. EKG shows sinus bradycardia with normal intervals and axis.

## 2015-04-02 ENCOUNTER — Telehealth: Payer: Self-pay

## 2015-04-02 LAB — VITAMIN D 25 HYDROXY (VIT D DEFICIENCY, FRACTURES): Vit D, 25-Hydroxy: 17 ng/mL — ABNORMAL LOW (ref 30–100)

## 2015-04-02 MED ORDER — VITAMIN D (ERGOCALCIFEROL) 1.25 MG (50000 UNIT) PO CAPS: 50000.0000 [IU] | ORAL_CAPSULE | ORAL | Status: DC

## 2015-04-02 NOTE — Telephone Encounter (Signed)
-----   Message from Elby Showers, MD sent at 04/02/2015 10:15 AM EST ----- Needs to take 50,000 Units Vitamin D3 weekly for 12 weeks then 2000 units Vitamin D3 daily OTC

## 2015-04-02 NOTE — Telephone Encounter (Signed)
Pt notified script sent to pharmacy.

## 2015-04-05 LAB — CYTOLOGY - PAP

## 2015-04-13 ENCOUNTER — Ambulatory Visit
Admission: RE | Admit: 2015-04-13 | Discharge: 2015-04-13 | Disposition: A | Discharge status: Home / Self Care | Payer: BC Managed Care – PPO | Source: Ambulatory Visit

## 2015-04-13 DIAGNOSIS — Z1231 Encounter for screening mammogram for malignant neoplasm of breast: Secondary | ICD-10-CM

## 2015-12-27 ENCOUNTER — Ambulatory Visit: Payer: BC Managed Care – PPO | Admitting: Internal Medicine

## 2016-04-04 ENCOUNTER — Encounter: Payer: Self-pay | Admitting: Internal Medicine

## 2016-04-04 ENCOUNTER — Ambulatory Visit (INDEPENDENT_AMBULATORY_CARE_PROVIDER_SITE_OTHER): Payer: BC Managed Care – PPO | Admitting: Internal Medicine

## 2016-04-04 VITALS — BP 120/62 | HR 58 | Temp 97.8°F | Ht 61.0 in | Wt 175.0 lb

## 2016-04-04 DIAGNOSIS — M5416 Radiculopathy, lumbar region: Secondary | ICD-10-CM

## 2016-04-04 MED ORDER — PHENTERMINE HCL 37.5 MG PO CAPS: 37.5000 mg | ORAL_CAPSULE | ORAL | 0 refills | Status: DC

## 2016-04-04 NOTE — Progress Notes (Signed)
   Subjective:    Patient ID: Richardson Dopp, female    DOB: [birth year], 58 y.o.   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 It is not responding to Advil or Aleve. It starts in her left lower back and radiates down her left posterior leg down to her lower leg. Says that leg feels a bit numb at times. In 2013, she had LS-spine films showing no evidence of acute abnormality. There was disc space narrowing at L5-S1. She is never had an MRI of the LS-spine. 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  Aside from this complaint, her general health is excellent. She had physical exam February 2017 and lab work was normal except for vitamin D which was low at 17.  Review of Systems      Objective:   Physical Exam Deep tendon reflexes in the left lower extremity 2+ and symmetrical. Muscle strength appears to be normal. No foot drop noted.       Assessment & Plan:  Left lumbar radiculopathy  Plan: Offered patient trial of steroids but she does not want to take steroids. She is afraid muscle relaxant will cause drowsiness. Chest again up at 5 AM. We going to see if we can get an MRI of the LS-spine approved.  Addendum: Patient request diet pills. Given her phentermine 37.5 mg #90 one by mouth daily.

## 2016-04-04 NOTE — Patient Instructions (Addendum)
Patient declined steroids and muscle relaxant. Try to get MRI of LS-spine approved. Phentermine 37.5 mg 1 by mouth daily for weight loss. #90 with no [id number]

## 2016-04-06 ENCOUNTER — Other Ambulatory Visit: Payer: Self-pay | Admitting: Internal Medicine

## 2016-04-06 DIAGNOSIS — M5416 Radiculopathy, lumbar region: Secondary | ICD-10-CM

## 2016-04-07 ENCOUNTER — Telehealth: Payer: Self-pay | Admitting: Internal Medicine

## 2016-04-07 NOTE — Telephone Encounter (Signed)
Patient was seen in our office on Tuesday, 2/27 for leg/back pain.  Dr. Renold Genta ordered MRI LS SPine without Contrast for patient.  Dx:  Left lumbar radiculopathy.  This order has been put into EPIC.  [facility] contacted patient; patient advised that she doesn't want to do this until June, 2018.  I am not going to contact insurance until that time because they assign effective dates for these tests once they authorize them.  For this reason, we do not need to do the pre-auth at this time.

## 2016-05-02 ENCOUNTER — Other Ambulatory Visit: Payer: Self-pay | Admitting: Internal Medicine

## 2016-05-02 DIAGNOSIS — Z1231 Encounter for screening mammogram for malignant neoplasm of breast: Secondary | ICD-10-CM

## 2016-05-10 ENCOUNTER — Telehealth: Payer: Self-pay | Admitting: Internal Medicine

## 2016-05-10 NOTE — Telephone Encounter (Signed)
Laura Silva from [place] called r/t Lumbar MRI without contrast scheduled for 4/5.  [place] @ [phone] and was transferred to [facility] and spoke with [facility].  Dx:  M54.5.  She denied the MRI and advised that Dr. Renold Genta would have to do a Peer to Peer.  Dr. Renold Genta called today and spoke with a Nurse and was provided with an [facility] #[id number] eff 05/09/16 - 06/07/16.    Called Laura Silva back at [place] with [facility] # [phone] @ [phone], ext [phone].  Also scanned the [employer] info into EPIC under Media.

## 2016-05-11 ENCOUNTER — Ambulatory Visit
Admission: RE | Admit: 2016-05-11 | Discharge: 2016-05-11 | Disposition: A | Discharge status: Home / Self Care | Payer: BC Managed Care – PPO | Source: Ambulatory Visit | Attending: Internal Medicine | Admitting: Internal Medicine

## 2016-05-11 DIAGNOSIS — M5416 Radiculopathy, lumbar region: Secondary | ICD-10-CM

## 2016-05-19 ENCOUNTER — Ambulatory Visit
Admission: RE | Admit: 2016-05-19 | Discharge: 2016-05-19 | Disposition: A | Discharge status: Home / Self Care | Payer: BC Managed Care – PPO | Source: Ambulatory Visit | Attending: Internal Medicine | Admitting: Internal Medicine

## 2016-05-19 DIAGNOSIS — Z1231 Encounter for screening mammogram for malignant neoplasm of breast: Secondary | ICD-10-CM

## 2016-05-25 ENCOUNTER — Telehealth: Payer: Self-pay | Admitting: Internal Medicine

## 2016-05-25 MED ORDER — IBUPROFEN 200 MG PO TABS: 800.0000 mg | ORAL_TABLET | Freq: Three times a day (TID) | ORAL | 0 refills | Status: DC

## 2016-05-25 NOTE — Telephone Encounter (Signed)
Call in Ibuprofen 800 mg tid #90 with one refill

## 2016-05-25 NOTE — Telephone Encounter (Signed)
Called in 800mg  po TID [facility] [address].

## 2016-05-25 NOTE — Telephone Encounter (Signed)
Patient called this morning to follow up on her appointment with Dr. Vertell Limber.  Advised that it would be the end of this week.  Told her that you spoke Hailey yesterday and Dr. Vertell Limber is reviewing her clinical information.  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  I reassured her that we understand and told her that with specialists, these are not quick appointments, they appointments take time; the physicians reserve the right to review the documentation and accept or decline the appointments and he will review her information and tell us by the end of the week.    She then wanted to know if you would call in 800mg  of Ibuprofen for her to [facility].    Pharmacy:  [facility] on [address] number to contact patient:  [phone]

## 2016-05-25 NOTE — Telephone Encounter (Signed)
Called in.

## 2016-05-25 NOTE — Addendum Note (Signed)
Addended by: Inocencio Homes on: 05/25/2016 12:38 PM   Modules accepted: Orders

## 2016-05-29 ENCOUNTER — Other Ambulatory Visit: Payer: BC Managed Care – PPO | Admitting: Internal Medicine

## 2016-05-29 DIAGNOSIS — Z1321 Encounter for screening for nutritional disorder: Secondary | ICD-10-CM

## 2016-05-29 DIAGNOSIS — Z0000 Encounter for general adult medical examination without abnormal findings: Secondary | ICD-10-CM

## 2016-05-29 DIAGNOSIS — Z1329 Encounter for screening for other suspected endocrine disorder: Secondary | ICD-10-CM

## 2016-05-29 DIAGNOSIS — Z13220 Encounter for screening for lipoid disorders: Secondary | ICD-10-CM

## 2016-05-29 LAB — CBC WITH DIFFERENTIAL/PLATELET
BASOS PCT: 0 %
Basophils Absolute: 0 cells/uL (ref 0–200)
EOS ABS: 0 {cells}/uL — AB (ref 15–500)
EOS PCT: 0 %
HCT: 38.9 % (ref 35.0–45.0)
Hemoglobin: 12.5 g/dL (ref 11.7–15.5)
Lymphocytes Relative: 36 %
Lymphs Abs: 2772 cells/uL (ref 850–3900)
MCH: 29.1 pg (ref 27.0–33.0)
MCHC: 32.1 g/dL (ref 32.0–36.0)
MCV: 90.7 fL (ref 80.0–100.0)
MONOS PCT: 5 %
MPV: 10.3 fL (ref 7.5–12.5)
Monocytes Absolute: 385 cells/uL (ref 200–950)
Neutro Abs: 4543 cells/uL (ref 1500–7800)
Neutrophils Relative %: 59 %
PLATELETS: 344 10*3/uL (ref 140–400)
RBC: 4.29 MIL/uL (ref 3.80–5.10)
RDW: 13.8 % (ref 11.0–15.0)
WBC: 7.7 10*3/uL (ref 3.8–10.8)

## 2016-05-29 LAB — LIPID PANEL
CHOLESTEROL: 185 mg/dL (ref <200)
HDL: 79 mg/dL (ref >50)
LDL Cholesterol: 96 mg/dL (ref <100)
Total CHOL/HDL Ratio: 2.3 Ratio (ref <5.0)
Triglycerides: 48 mg/dL (ref <150)
VLDL: 10 mg/dL (ref <30)

## 2016-05-29 LAB — COMPREHENSIVE METABOLIC PANEL
ALK PHOS: 91 U/L (ref 33–130)
ALT: 17 U/L (ref 6–29)
AST: 23 U/L (ref 10–35)
Albumin: 4.7 g/dL (ref 3.6–5.1)
BUN: 8 mg/dL (ref 7–25)
CO2: 28 mmol/L (ref 20–31)
CREATININE: 0.9 mg/dL (ref 0.50–1.05)
Calcium: 10 mg/dL (ref 8.6–10.4)
Chloride: 102 mmol/L (ref 98–110)
GLUCOSE: 91 mg/dL (ref 65–99)
Potassium: 5 mmol/L (ref 3.5–5.3)
SODIUM: 139 mmol/L (ref 135–146)
Total Bilirubin: 0.5 mg/dL (ref 0.2–1.2)
Total Protein: 7.5 g/dL (ref 6.1–8.1)

## 2016-05-29 LAB — TSH: TSH: 1.2 m[IU]/L

## 2016-05-30 LAB — VITAMIN D 25 HYDROXY (VIT D DEFICIENCY, FRACTURES): VIT D 25 HYDROXY: 49 ng/mL (ref 30–100)

## 2016-06-05 ENCOUNTER — Ambulatory Visit (INDEPENDENT_AMBULATORY_CARE_PROVIDER_SITE_OTHER): Payer: BC Managed Care – PPO | Admitting: Internal Medicine

## 2016-06-05 ENCOUNTER — Encounter: Payer: Self-pay | Admitting: Internal Medicine

## 2016-06-05 VITALS — BP 122/70 | HR 66 | Temp 98.1°F | Ht 60.5 in | Wt 165.0 lb

## 2016-06-05 DIAGNOSIS — Z6831 Body mass index (BMI) 31.0-31.9, adult: Secondary | ICD-10-CM

## 2016-06-05 DIAGNOSIS — M5416 Radiculopathy, lumbar region: Secondary | ICD-10-CM | POA: Diagnosis not present

## 2016-06-05 DIAGNOSIS — Z0000 Encounter for general adult medical examination without abnormal findings: Secondary | ICD-10-CM

## 2016-06-05 LAB — POCT URINALYSIS DIPSTICK
Bilirubin, UA: NEGATIVE
Glucose, UA: NEGATIVE
KETONES UA: NEGATIVE
LEUKOCYTES UA: NEGATIVE
Nitrite, UA: NEGATIVE
Protein, UA: NEGATIVE
SPEC GRAV UA: 1.015 (ref 1.010–1.025)
Urobilinogen, UA: 0.2 E.U./dL
pH, UA: 7 (ref 5.0–8.0)

## 2016-06-05 MED ORDER — PHENTERMINE HCL 37.5 MG PO CAPS: 37.5000 mg | ORAL_CAPSULE | Freq: Every day | ORAL | 0 refills | Status: DC

## 2016-06-05 NOTE — Progress Notes (Signed)
Subjective:    Patient ID: Laura Silva, female    DOB: 12/12/1960, 56 y.o.   MRN: [id number]  HPI 56 year old Black Female in today for health maintenance exam and evaluation of medical issues.  Pt has had issues with low back pain for some time and recently had MRI of the LS spine. Will be seeing Dr. Vertell Limber in late May. She's been taking ibuprofen with some relief.  She's lost 15 pounds since 2017. She started in late February with phentermine 37.5 mg daily and a low-fat diet. Says she gets at least 10 miles of exercise at her work every day. 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  Recently had MRI of the LS-spine showing L3-L4 left subarticular and foraminal zone disc protrusion moderately narrowing the left neural foramen with contact on the exiting left L3 nerve root and contact on the descending left L4 nerve root in the left lateral recess.  Remote history of sarcoidosis in 1989 treated by Dr. Keturah Barre with steroids with no further exacerbations. History of allergic rhinitis. She was allergy tested by Dr. Annamaria Boots in 1990 and was found to have allergies to weeds, grass, tree pollens, dust, dust mites, tobacco and molds.  She takes over-the-counter allergy medication.  History of de Quervain's tenosynovitis November 2002 the right wrist.  History of bacterial vaginosis.  Past medical history: Tubal ligation 1992. Hepatitis B series given at [facility] when she was working there in 2003. History of sciatica in the remote past.  She is menopausal.  Family history: Father died of history of diabetes complications. Mother with history of hypertension, hyperlipidemia, rheumatoid arthritis. One sister with hypertension and diabetes. One sister died with some type of intestinal cancer. One sister with history of morbid obesity who has had bariatric surgery.  Flu vaccine through employment.    Review of Systems    Constitutional: Negative.   Respiratory: Negative.   Gastrointestinal: Negative.   Genitourinary: Negative.   Musculoskeletal: Positive for back pain.  Psychiatric/Behavioral: Negative.        Objective:   Physical Exam  Constitutional: She is oriented to person, place, and time. She appears well-developed and well-nourished. No distress.  HENT:  Head: Normocephalic and atraumatic.  Right Ear: External ear normal.  Left Ear: External ear normal.  Mouth/Throat: Oropharynx is clear and moist. No oropharyngeal exudate.  Eyes: Conjunctivae and EOM are normal. Pupils are equal, round, and reactive to light. Right eye exhibits no discharge. Left eye exhibits no discharge. No scleral icterus.  Neck: Neck supple. No JVD present. No thyromegaly present.  Cardiovascular: Normal rate, regular rhythm, normal heart sounds and intact distal pulses.   No murmur heard. Pulmonary/Chest: Effort normal and breath sounds normal. No respiratory distress. She has no wheezes. She has no rales.  Abdominal: Soft. Bowel sounds are normal. She exhibits no distension and no mass. There is no tenderness. There is no rebound and no guarding.  Genitourinary:  Genitourinary Comments: Pap taken last year. Bimanual normal.  Musculoskeletal: She exhibits no edema.  Lymphadenopathy:    She has no cervical adenopathy.  Neurological: She is alert and oriented to person, place, and time. She has normal reflexes. No cranial nerve deficit. Coordination normal.  Skin: Skin is warm and dry. No rash noted. She is not diaphoretic.  Psychiatric: She has a normal mood and affect. Her behavior is normal. Judgment and thought content normal.  Vitals reviewed.  Assessment & Plan:  Left lumbar radiculopathy L3-L4-to see Dr. Vertell Limber in late May  BMI 31 continue diet and exercise efforts. Phentermine refilled for an additional 90 days beginning May 28. Cannot stale [place] more than 12 weeks.  Remote history of  sarcoidosis  Plan: Return in one year or as needed. Lab work reviewed with her and is entirely within normal limits.

## 2016-06-05 NOTE — Patient Instructions (Signed)
Continue diet exercise and weight loss regimen. It was a pleasure to see you today. Evaluation with Dr. Vertell Limber regarding back pain in late May. Return in one year or as needed. Phentermine refilled beginning May 28 for 90 days.

## 2017-06-19 ENCOUNTER — Other Ambulatory Visit: Payer: Self-pay | Admitting: Internal Medicine

## 2017-06-19 DIAGNOSIS — Z1231 Encounter for screening mammogram for malignant neoplasm of breast: Secondary | ICD-10-CM

## 2017-08-06 ENCOUNTER — Encounter: Payer: Self-pay | Admitting: Internal Medicine

## 2017-08-06 ENCOUNTER — Ambulatory Visit: Payer: BC Managed Care – PPO | Admitting: Internal Medicine

## 2017-08-06 VITALS — BP 110/70 | HR 68 | Temp 98.1°F | Ht 60.5 in | Wt 149.0 lb

## 2017-08-06 DIAGNOSIS — R55 Syncope and collapse: Secondary | ICD-10-CM

## 2017-08-06 DIAGNOSIS — I951 Orthostatic hypotension: Secondary | ICD-10-CM

## 2017-08-06 DIAGNOSIS — J302 Other seasonal allergic rhinitis: Secondary | ICD-10-CM | POA: Diagnosis not present

## 2017-08-06 MED ORDER — METHYLPREDNISOLONE ACETATE 80 MG/ML IJ SUSP
80.0000 mg | Freq: Once | INTRAMUSCULAR | Status: AC
  Administered 2017-08-06: 80 mg via INTRAMUSCULAR

## 2017-08-06 NOTE — Progress Notes (Signed)
   Subjective:    Patient ID: Laura Silva, female    DOB: Jul 21, 1960, 57 y.o.   MRN: [id number]  HPI Here today complaining of lightheadedness which is somewhat nonspecific.  Has not had syncope. Hx left lumbar radiculopathy L3-L4 Drinks herbal green tea x 2 months.  Not on any other over-the-counter medications.  General health is good.  Review of Systems no chest pain.  No shortness of breath.  She is complaining of some allergy type symptoms with maxillary sinus pressure and tenderness.  Has upcoming trip to [place] and seems a bit anxious.     Objective:   Physical Exam Orthostatic blood pressure readings showed a significant drop from lying to standing between 130/80 lying to 108/78 standing.  Chest clear.  Cardiac exam regular rate and rhythm.  Extremities without edema.  Neuro no focal deficits on brief neurological exam.       Assessment & Plan:  Orthostatic hypotension-etiology unclear  Allergic rhinitis -possible early sinusitis given Depo-Medrol 80 mg IM.  Plan: Recommend discontinue herbal tea.  Stay well-hydrated.  Call if symptoms do not resolve.  No other medications to explain symptoms.  Has upcoming physical exam in the near future.

## 2017-08-07 LAB — CBC WITH DIFFERENTIAL/PLATELET
BASOS PCT: 0.4 %
Basophils Absolute: 41 cells/uL (ref 0–200)
EOS ABS: 52 {cells}/uL (ref 15–500)
Eosinophils Relative: 0.5 %
HCT: 40.5 % (ref 35.0–45.0)
HEMOGLOBIN: 13.3 g/dL (ref 11.7–15.5)
Lymphs Abs: 3832 cells/uL (ref 850–3900)
MCH: 28.9 pg (ref 27.0–33.0)
MCHC: 32.8 g/dL (ref 32.0–36.0)
MCV: 88 fL (ref 80.0–100.0)
MPV: 11 fL (ref 7.5–12.5)
Monocytes Relative: 6 %
NEUTROS ABS: 5758 {cells}/uL (ref 1500–7800)
Neutrophils Relative %: 55.9 %
PLATELETS: 304 10*3/uL (ref 140–400)
RBC: 4.6 10*6/uL (ref 3.80–5.10)
RDW: 12.1 % (ref 11.0–15.0)
TOTAL LYMPHOCYTE: 37.2 %
WBC: 10.3 10*3/uL (ref 3.8–10.8)
WBCMIX: 618 {cells}/uL (ref 200–950)

## 2017-08-07 LAB — COMPLETE METABOLIC PANEL WITH GFR
AG Ratio: 1.8 (calc) (ref 1.0–2.5)
ALBUMIN MSPROF: 4.9 g/dL (ref 3.6–5.1)
ALKALINE PHOSPHATASE (APISO): 96 U/L (ref 33–130)
ALT: 14 U/L (ref 6–29)
AST: 20 U/L (ref 10–35)
BILIRUBIN TOTAL: 0.5 mg/dL (ref 0.2–1.2)
BUN / CREAT RATIO: 6 (calc) (ref 6–22)
BUN: 5 mg/dL — AB (ref 7–25)
CO2: 26 mmol/L (ref 20–32)
CREATININE: 0.85 mg/dL (ref 0.50–1.05)
Calcium: 10.4 mg/dL (ref 8.6–10.4)
Chloride: 105 mmol/L (ref 98–110)
GFR, EST AFRICAN AMERICAN: 88 mL/min/{1.73_m2} (ref >=60)
GFR, EST NON AFRICAN AMERICAN: 76 mL/min/{1.73_m2} (ref >=60)
GLOBULIN: 2.7 g/dL (ref 1.9–3.7)
Glucose, Bld: 93 mg/dL (ref 65–99)
Potassium: 4.2 mmol/L (ref 3.5–5.3)
SODIUM: 142 mmol/L (ref 135–146)
TOTAL PROTEIN: 7.6 g/dL (ref 6.1–8.1)

## 2017-08-07 LAB — TSH: TSH: 1.65 m[IU]/L (ref 0.40–4.50)

## 2017-08-07 LAB — IRON,TIBC AND FERRITIN PANEL
%SAT: 27 % (ref 16–45)
Ferritin: 58 ng/mL (ref 16–232)
Iron: 96 ug/dL (ref 45–160)
TIBC: 362 mcg/dL (calc) (ref 250–450)

## 2017-08-07 LAB — T4, FREE: Free T4: 1 ng/dL (ref 0.8–1.8)

## 2017-08-13 ENCOUNTER — Other Ambulatory Visit: Payer: Self-pay

## 2017-08-13 ENCOUNTER — Telehealth: Payer: Self-pay | Admitting: Internal Medicine

## 2017-08-13 MED ORDER — METRONIDAZOLE 1 % EX GEL: Freq: Every day | CUTANEOUS | 3 refills | Status: DC

## 2017-08-13 NOTE — Telephone Encounter (Signed)
Sharyn Lull it is NOT listed on her med list. Does the pharmacy have this for her under another provider such as a Dermatologist?

## 2017-08-13 NOTE — Telephone Encounter (Signed)
Metrogel 1% was prescribed back in 2016.  [facility].  Per verbal order from Dr. Renold Genta, Ar[facility] has sent Metrogel 1% to [facility] for the patient with 3 refills.  It was prescribed as it was written in 2016.    Spoke with patient @ [phone] to advise of this information.

## 2017-08-13 NOTE — Telephone Encounter (Signed)
Patient called this morning and was advised to contact her pharmacy to have medication refilled for her rosacea.  She then called back and stated that she could not get through to the pharmacy.    She states the name of the medication is:  Isotretinoin 1%.  She said that it is a gel and you put it on real thin.    Pharmacy:  [facility] on [address]  Thank you.

## 2017-08-20 ENCOUNTER — Other Ambulatory Visit: Payer: Self-pay

## 2017-08-20 ENCOUNTER — Ambulatory Visit (HOSPITAL_COMMUNITY)
Admission: EM | Admit: 2017-08-20 | Discharge: 2017-08-20 | Disposition: A | Discharge status: Home / Self Care | Payer: BC Managed Care – PPO | Attending: Family Medicine | Admitting: Family Medicine

## 2017-08-20 ENCOUNTER — Encounter (HOSPITAL_COMMUNITY): Payer: Self-pay | Admitting: Emergency Medicine

## 2017-08-20 DIAGNOSIS — T162XXA Foreign body in left ear, initial encounter: Secondary | ICD-10-CM

## 2017-08-20 NOTE — ED Provider Notes (Signed)
[place]    CSN: [id number] Arrival date & time: 08/20/17  2094     History   Chief Complaint Chief Complaint  Patient presents with  . Foreign Body in [place]    left    HPI Laura Silva is a 57 y.o. female.   Vernesha presents with complaints of cotton from q-tip to left ear. Occurred this morning. Minimal pain. No drainage. Right ear without complaints. Denies any previous similar. Not taking any medications regularly. Without contributing medical history.     ROS per HPI.      Past Medical History:  Diagnosis Date  . Allergy   . Constipation   . Environmental allergies   . Fluid retention   . Herpes simplex virus type 1 (HSV-1) dermatitis   . Iron deficiency   . Sarcoidosis     Patient Active Problem List   Diagnosis Date Noted  . Menopause 02/04/2013  . History of sciatica 04/27/2011  . History of sarcoidosis 12/13/2010  . Allergic rhinitis 12/13/2010    Past Surgical History:  Procedure Laterality Date  . TUBAL LIGATION      OB History   None      Home Medications    Prior to Admission medications   Medication Sig Start Date End Date Taking? Authorizing Provider  cholecalciferol (VITAMIN D) 1000 units tablet Take 3,000 Units by mouth daily.    [provider]  ibuprofen (ADVIL) 200 MG tablet Take 4 tablets (800 mg total) by mouth 3 (three) times daily. Patient not taking: Reported on 08/06/2017 05/25/16   Elby Showers, MD  Loratadine (CLARITIN PO) Take by mouth.    [provider]  metroNIDAZOLE (METROGEL) 1 % gel Apply topically daily. 08/13/17   Elby Showers, MD  phentermine 37.5 MG capsule Take 1 capsule (37.5 mg total) by mouth daily before breakfast. Do not Fill until May 28,2018 Patient not taking: Reported on 08/06/2017 06/05/16   Elby Showers, MD    Family History Family History  Problem Relation Age of Onset  . Hyperlipidemia Mother   . Hypertension Mother   . Diabetes Mother   . Heart disease  Mother   . Diabetes Father   . Cancer Sister   . Colon cancer Paternal Uncle     Social History Social History   Tobacco Use  . Smoking status: Former Smoker    Types: Cigarettes    Last attempt to quit: 12/12/1980    Years since quitting: 36.7  . Smokeless tobacco: Never Used  Substance Use Topics  . Alcohol use: No  . Drug use: No     Allergies   Patient has no known allergies.   Review of Systems Review of Systems   Physical Exam Triage Vital Signs ED Triage Vitals  Enc Vitals Group     BP 08/20/17 0827 (!) 147/83     Pulse Rate 08/20/17 0827 61     Resp 08/20/17 0827 16     Temp 08/20/17 0827 99.2 F (37.3 C)     Temp Source 08/20/17 0827 Oral     SpO2 08/20/17 0827 96 %     Weight --      Height --      Head Circumference --      Peak Flow --      Pain Score 08/20/17 0829 0     Pain Loc --      Pain Edu? --      Excl. in [place]? --  No data found.  Updated Vital Signs BP (!) 147/83 (BP Location: Left Arm) Comment: reported BP to nurse Kim Lapan Hutchens  Pulse 61   Temp 99.2 F (37.3 C) (Oral)   Resp 16   LMP 10/20/2013 (Exact Date)   SpO2 96%   Visual Acuity Right Eye Distance:   Left Eye Distance:   Bilateral Distance:    Right Eye Near:   Left Eye Near:    Bilateral Near:     Physical Exam  Constitutional: She is oriented to person, place, and time. She appears well-developed and well-nourished. No distress.  HENT:  Head: Normocephalic and atraumatic.  Right Ear: Hearing, tympanic membrane, external ear and ear canal normal.  Left Ear: Hearing and tympanic membrane normal.  Cotton to left ear canal noted on initial exam. No redness or drainage; no cotton remaining s/p irrigation of canal; no redness, drainage, TM WNL  Cardiovascular: Normal rate, regular rhythm and normal heart sounds.  Pulmonary/Chest: Effort normal and breath sounds normal.  Neurological: She is alert and oriented to person, place, and time.  Skin: Skin is warm and  dry.     UC Treatments / Results  Labs (all labs ordered are listed, but only abnormal results are displayed) Labs Reviewed - No data to display  EKG None  Radiology No results found.  Procedures Procedures (including critical care time)  Medications Ordered in UC Medications - No data to display  Initial Impression / Assessment and Plan / UC Course  I have reviewed the triage vital signs and the nursing notes.  Pertinent labs & imaging results that were available during my care of the patient were reviewed by me and considered in my medical decision making (see chart for details).     Left ear irrigated for cotton removal. Patient tolerated. No remaining cotton.   Final Clinical Impressions(s) / UC Diagnoses   Final diagnoses:  Foreign body of left ear, initial encounter   Discharge Instructions   None    ED Prescriptions    None     Controlled Substance Prescriptions [place] Controlled Substance Registry consulted? Not Applicable   Zigmund Gottron, NP 08/20/17 [phone]

## 2017-08-20 NOTE — ED Triage Notes (Signed)
Pt states she has a part of a cotton ball stuck in her left ear from this morning.

## 2017-08-27 ENCOUNTER — Other Ambulatory Visit: Payer: Self-pay | Admitting: Internal Medicine

## 2017-08-27 DIAGNOSIS — Z8639 Personal history of other endocrine, nutritional and metabolic disease: Secondary | ICD-10-CM

## 2017-08-31 ENCOUNTER — Ambulatory Visit (INDEPENDENT_AMBULATORY_CARE_PROVIDER_SITE_OTHER): Payer: BC Managed Care – PPO | Admitting: Internal Medicine

## 2017-08-31 ENCOUNTER — Encounter: Payer: BC Managed Care – PPO | Admitting: Internal Medicine

## 2017-08-31 ENCOUNTER — Ambulatory Visit
Admission: RE | Admit: 2017-08-31 | Discharge: 2017-08-31 | Disposition: A | Discharge status: Home / Self Care | Payer: BC Managed Care – PPO | Source: Ambulatory Visit | Attending: Internal Medicine | Admitting: Internal Medicine

## 2017-08-31 ENCOUNTER — Encounter: Payer: Self-pay | Admitting: Internal Medicine

## 2017-08-31 VITALS — BP 120/78 | HR 58 | Temp 98.0°F | Ht 60.5 in | Wt 147.0 lb

## 2017-08-31 DIAGNOSIS — Z8639 Personal history of other endocrine, nutritional and metabolic disease: Secondary | ICD-10-CM

## 2017-08-31 DIAGNOSIS — Z0000 Encounter for general adult medical examination without abnormal findings: Secondary | ICD-10-CM

## 2017-08-31 DIAGNOSIS — R319 Hematuria, unspecified: Secondary | ICD-10-CM | POA: Diagnosis not present

## 2017-08-31 DIAGNOSIS — Z1231 Encounter for screening mammogram for malignant neoplasm of breast: Secondary | ICD-10-CM

## 2017-08-31 LAB — POCT URINALYSIS DIPSTICK
BILIRUBIN UA: NEGATIVE
Glucose, UA: NEGATIVE
Ketones, UA: NEGATIVE
LEUKOCYTES UA: NEGATIVE
NITRITE UA: NEGATIVE
PH UA: 7 (ref 5.0–8.0)
PROTEIN UA: NEGATIVE
Spec Grav, UA: 1.01 (ref 1.010–1.025)
Urobilinogen, UA: 0.2 E.U./dL

## 2017-08-31 LAB — LIPID PANEL
CHOLESTEROL: 197 mg/dL (ref <200)
HDL: 71 mg/dL (ref >50)
LDL Cholesterol (Calc): 113 mg/dL (calc) — ABNORMAL HIGH
Non-HDL Cholesterol (Calc): 126 mg/dL (calc) (ref <130)
TRIGLYCERIDES: 50 mg/dL (ref <150)
Total CHOL/HDL Ratio: 2.8 (calc) (ref <5.0)

## 2017-08-31 NOTE — Progress Notes (Signed)
Subjective:    Patient ID: Laura Silva, female    DOB: [birth year], 57 y.o.   MRN: [id number]  HPI 57 year old Black Female exam and evaluation of medical issues.  In 2018 she saw Dr. Vertell Limber for low back pain.  He felt she did not need surgery.  Sometimes it bothers her but it has improved considerably.  She had an MRI of the LS spine showing L3-L4 left subarticular and foraminal zone disc protrusion with moderate narrowing of the left neural foramen with contact on the exiting left L3 nerve root and contact on the descending left L4 nerve root in the left lateral recess.  Remote history of sarcoidosis in 1989 treated by Dr. Keturah Barre with steroids with no further exacerbations.  History of allergic rhinitis.  She was allergy tested by Dr. Annamaria Boots in 1990 was found to have allergies to wheeze, grass, tree pollens, dust, dust mites tobacco and molds.  She takes over-the-counter allergy medication.  History of de Quervain's tenosynovitis November 2002 of the right wrist.  History of bacterial vaginosis.  She is menopausal.  Past medical history: Tubal ligation 1992.  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  Social history: She is married.  One adult son and 1 grandchild.  Does not smoke or consume alcohol.  Works at [employer] in [employer].  Family history: Father died of diabetes complications.  Mother with history of rheumatoid arthritis, hypertension, hyperlipidemia.  One sister with hypertension and diabetes.  One sister died with some type of intestinal cancer.  One sister with history of morbid obesity who is had bariatric surgery.     Review of Systems  Constitutional: Negative.   All other systems reviewed and are negative.      Objective:   Physical Exam  Constitutional: She is oriented to person, place, and time. She appears well-developed and well-nourished.  HENT:  Head: Normocephalic and atraumatic.  Right Ear: External ear  normal.  Left Ear: External ear normal.  Mouth/Throat: Oropharynx is clear and moist. No oropharyngeal exudate.  Eyes: Pupils are equal, round, and reactive to light. Conjunctivae and EOM are normal. Right eye exhibits no discharge. Left eye exhibits no discharge.  Neck: Neck supple. No JVD present. No thyromegaly present.  Cardiovascular: Normal rate, regular rhythm and normal heart sounds.  No murmur heard. Pulmonary/Chest: Effort normal and breath sounds normal. No stridor. No respiratory distress. She has no wheezes. She has no rales.  Abdominal: Soft. Bowel sounds are normal. She exhibits no distension and no mass. There is no tenderness. There is no rebound and no guarding. No hernia.  Genitourinary:  Genitourinary Comments: Pap done 2017 and will be deferred until next year.  Bimanual normal  Musculoskeletal: She exhibits no edema.  Lymphadenopathy:    She has no cervical adenopathy.  Neurological: She is alert and oriented to person, place, and time. She displays normal reflexes. No cranial nerve deficit or sensory deficit. She exhibits normal muscle tone. Coordination normal.  Skin: Skin is warm and dry.  Psychiatric: She has a normal mood and affect. Her behavior is normal. Judgment and thought content normal.  Vitals reviewed.         Assessment & Plan:  Normal health maintenance exam  Fasting lipid panel drawn today.  Other labs reviewed including TSH CBC with differential and C met are within normal limits as well as iron studies.  Remote history of sarcoidosis with no recurrence  History of  lumbar radiculopathy L3-L4-improved  Plan: Return in 1 year or as needed.  Urine dipstick has trace occult blood and will be sent for microscopic evaluation.  She is asymptomatic.

## 2017-08-31 NOTE — Patient Instructions (Addendum)
It was a pleasure to see you today.  Fasting lipid panel pending.  Return in 1 year or as needed.

## 2017-09-01 ENCOUNTER — Encounter: Payer: Self-pay | Admitting: Internal Medicine

## 2017-09-01 LAB — URINE CULTURE
MICRO NUMBER:: 90887932
SPECIMEN QUALITY:: ADEQUATE

## 2017-09-01 NOTE — Patient Instructions (Signed)
Continue over-the-counter antihistamine.  Depo-Medrol 80 mg IM.  Patient her to stay well-hydrated and call back if symptoms persist.

## 2017-10-04 ENCOUNTER — Telehealth: Payer: Self-pay | Admitting: Internal Medicine

## 2017-10-04 NOTE — Telephone Encounter (Signed)
Needs OV.  

## 2017-10-04 NOTE — Telephone Encounter (Signed)
States that she has been sick with sinus drainage and allergies since she was in for her visit with [place] 7/15.  Said she has been taking OTC sinus medications and allergy medications and it has not gotten better.  No fever, just the continuous drainage and states that it is causing a bad odor in her mouth.  Wants to know if you will call something in for her for this sinus drainage?    Pharmacy:  [facility] at [facility]  Phone:  [phone]

## 2017-10-04 NOTE — Telephone Encounter (Signed)
SCHEDULED

## 2017-10-05 ENCOUNTER — Ambulatory Visit: Payer: BC Managed Care – PPO | Admitting: Internal Medicine

## 2017-10-05 ENCOUNTER — Encounter: Payer: Self-pay | Admitting: Internal Medicine

## 2017-10-05 VITALS — Ht 61.0 in | Wt 152.0 lb

## 2017-10-05 DIAGNOSIS — J0100 Acute maxillary sinusitis, unspecified: Secondary | ICD-10-CM

## 2017-10-05 DIAGNOSIS — R110 Nausea: Secondary | ICD-10-CM

## 2017-10-05 MED ORDER — FLUCONAZOLE 150 MG PO TABS: 150.0000 mg | ORAL_TABLET | Freq: Once | ORAL | 0 refills | Status: AC

## 2017-10-05 MED ORDER — ONDANSETRON HCL 4 MG PO TABS: 4.0000 mg | ORAL_TABLET | Freq: Three times a day (TID) | ORAL | 0 refills | Status: DC | PRN

## 2017-10-05 MED ORDER — DOXYCYCLINE HYCLATE 100 MG PO TABS: 100.0000 mg | ORAL_TABLET | Freq: Two times a day (BID) | ORAL | 0 refills | Status: DC

## 2017-10-05 MED ORDER — METHYLPREDNISOLONE ACETATE 80 MG/ML IJ SUSP
80.0000 mg | Freq: Once | INTRAMUSCULAR | Status: AC
  Administered 2017-10-05: 80 mg via INTRAMUSCULAR

## 2017-10-05 NOTE — Patient Instructions (Addendum)
Depomedrol 80 mg IM. Doxycycline 100 mg twice daily x 10 days.Diflucan if needed for vaginitis. Zofran for nausea

## 2017-10-05 NOTE — Progress Notes (Signed)
   Subjective:    Patient ID: Laura Silva, female    DOB: March 24, 1960, 57 y.o.   MRN: [id number]  HPI Patient was on a trip to [place] at the end of July after last visit here.  Felt that she may have come down with a respiratory infection.  When she returned she noted postnasal drip and some nausea.  No fever or shaking chills.  No significant cough.  Sounds nasally congested when she speaks.  Has not gotten better after taking over-the-counter medications.  Says the postnasal drip is discolored and he is thick in the morning.   Review of Systems see above-some slight scratchy throat     Objective:   Physical Exam Skin warm and dry.  TMs are clear.  Neck is supple without adenopathy.  Chest clear to auscultation without rales or wheezing.  Pharynx is clear.       Assessment & Plan:  Acute maxillary sinusitis  Plan: Depo-Medrol 80 mg IM.  Doxycycline 100 mg twice daily for 10 days.  Diflucan if needed for Candida vaginitis while on antibiotics.  Zofran 4 mg tablets as needed nausea

## 2018-06-10 IMAGING — MR MR LUMBAR SPINE W/O CM
5 series · 44 of 48 positions shown · non-contrast
Comparison: 03/29/2011 lumbar radiographs.

CLINICAL DATA: 55 y/o F; lower back pain with left leg pain,
weakness, and numbness.

EXAM:
MRI LUMBAR SPINE WITHOUT CONTRAST
TECHNIQUE: Multiplanar, multisequence MR imaging of the lumbar spine was
performed. No intravenous contrast was administered.

[Series 3: T2 · sagittal · 4.0mm · 0.94mm/px · 6 of 13 slices shown (1 of 2)]
[im 1/13]
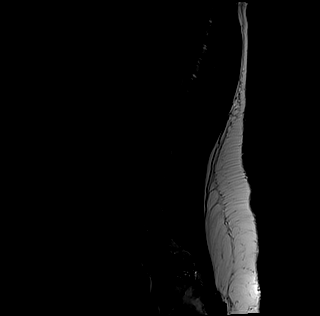
[im 3/13]
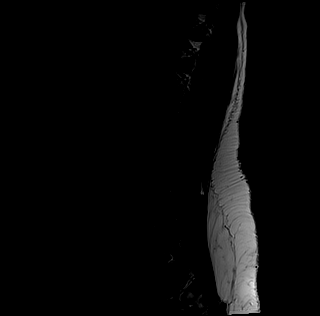
[im 5/13]
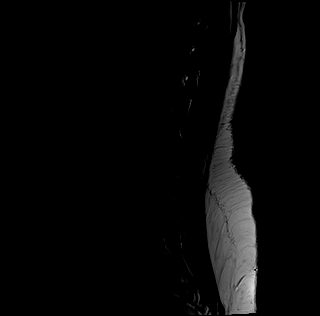
[im 8/13]
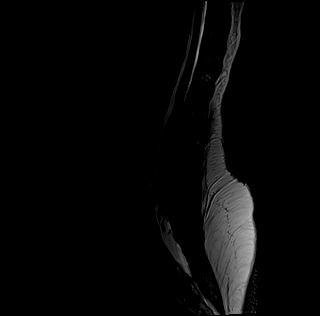
[im 10/13]
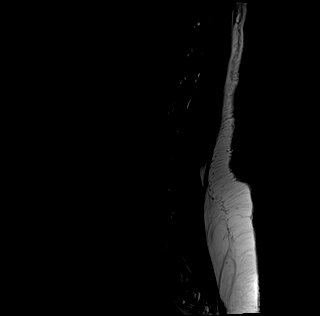
[im 13/13]
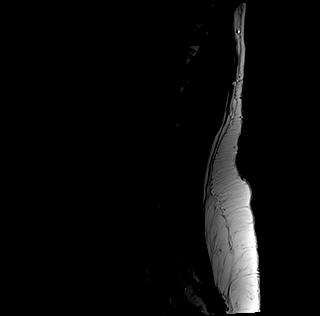

[Series 4: T1 · sagittal · 4.0mm · 0.94mm/px · 6 of 13 slices shown (1 of 2)]
[im 1/13]
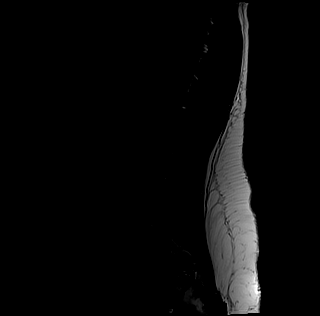
[im 3/13]
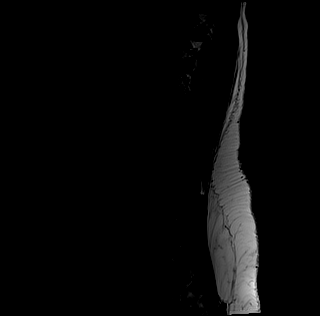
[im 5/13]
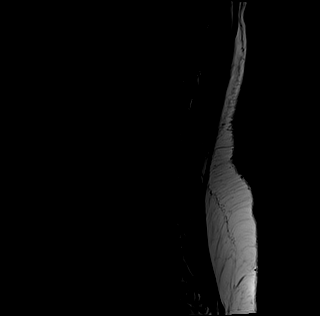
[im 8/13]
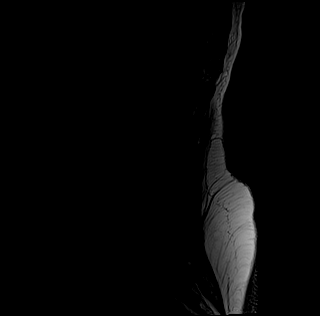
[im 10/13]
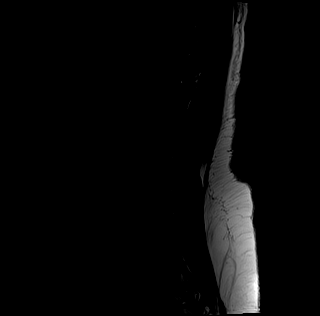
[im 13/13]
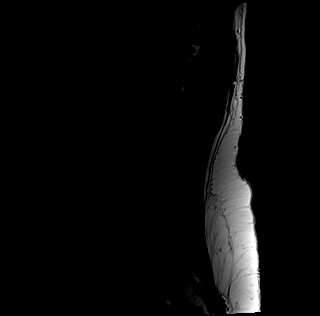

[Series 5: tirm sag · sagittal · 4.0mm · 0.59mm/px · 6 of 13 slices shown]
[im 1/13]
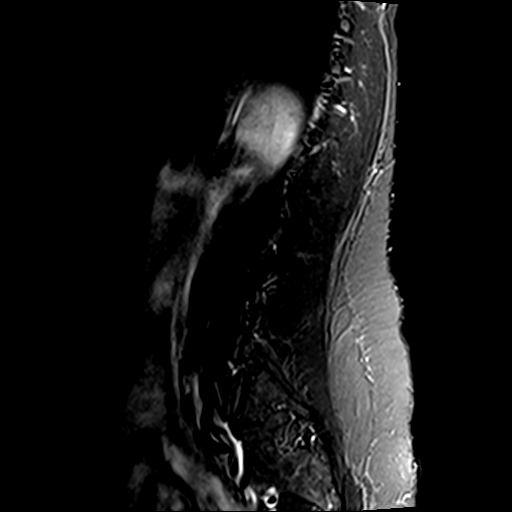
[im 3/13]
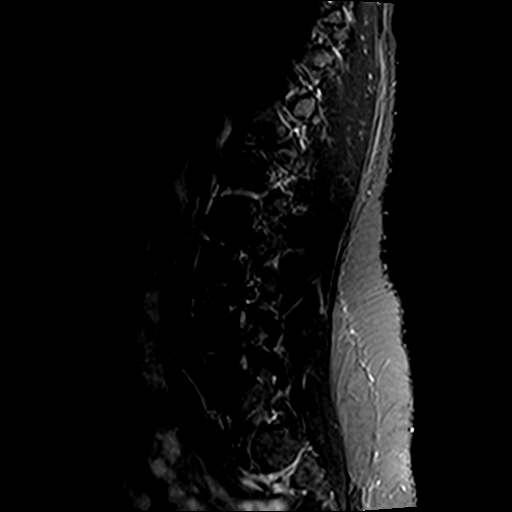
[im 5/13]
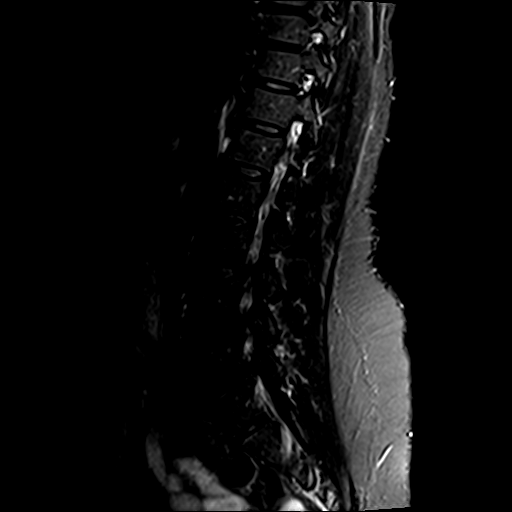
[im 8/13]
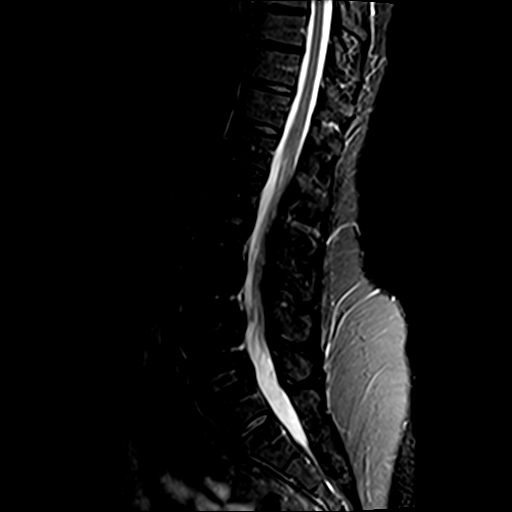
[im 10/13]
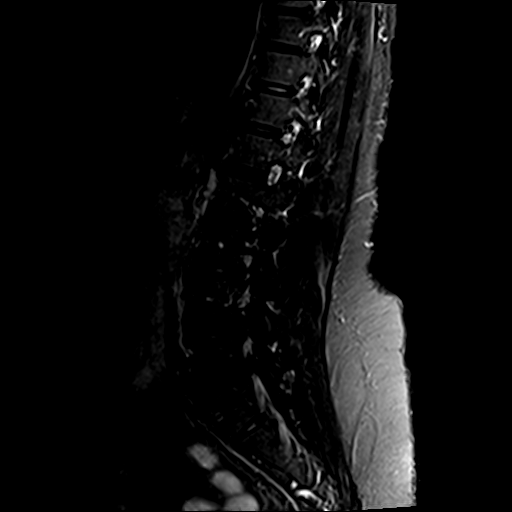
[im 13/13]
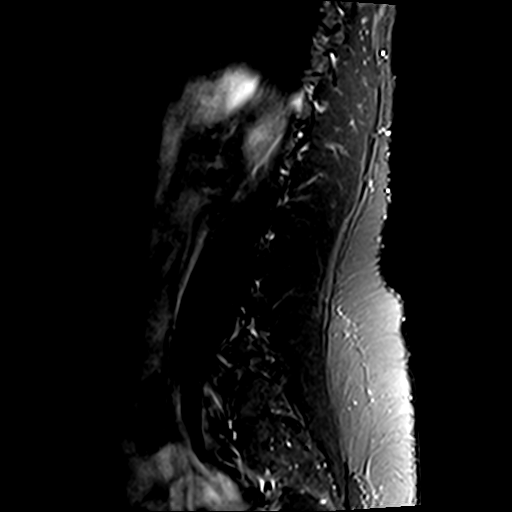

[Series 6: T1 · axial · 4.0mm · 0.78mm/px · z∈[-38,+154]mm · 11 of 35 slices shown (2 of 2)]
[im 1/35]
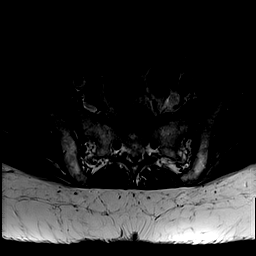
[im 3/35]
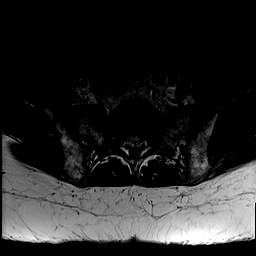
[im 5/35]
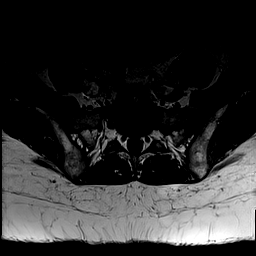
[im 8/35]
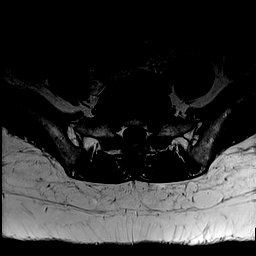
[im 10/35]
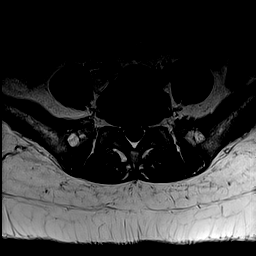
[im 15/35]
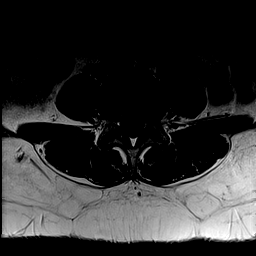
[im 18/35]
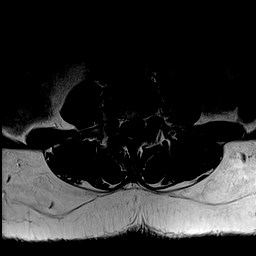
[im 20/35]
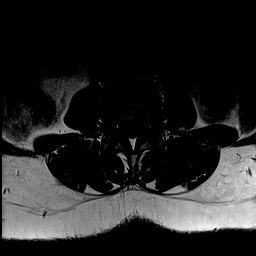
[im 25/35]
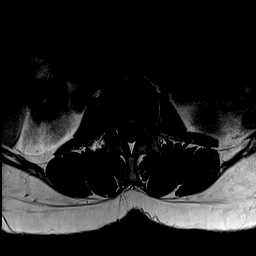
[im 30/35]
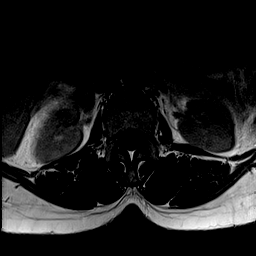
[im 35/35]
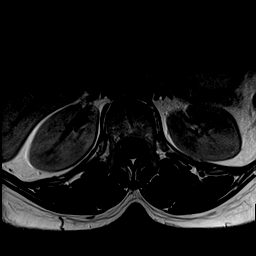

[Series 7: T2 · axial · 4.0mm · 0.78mm/px · z∈[-38,+154]mm · 15 of 35 slices shown (2 of 2)]
[im 1/35]
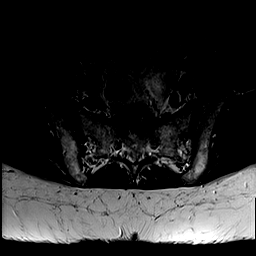
[im 3/35]
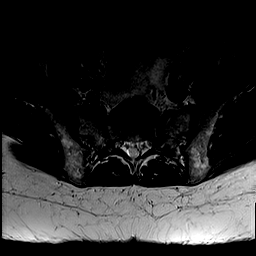
[im 5/35]
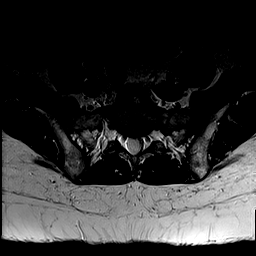
[im 8/35]
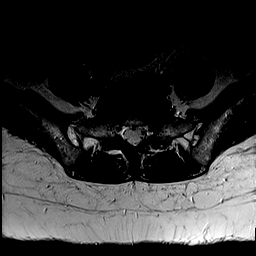
[im 10/35]
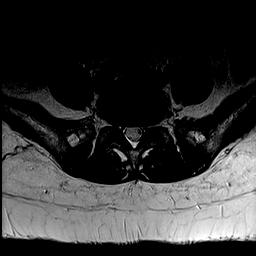
[im 13/35]
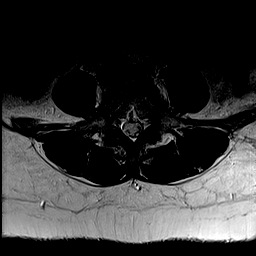
[im 15/35]
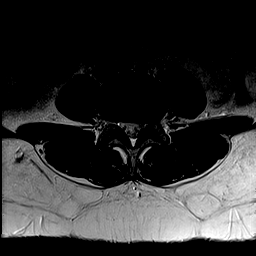
[im 18/35]
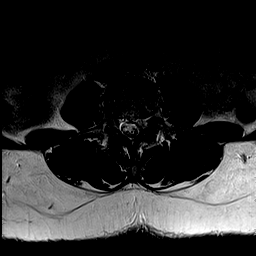
[im 20/35]
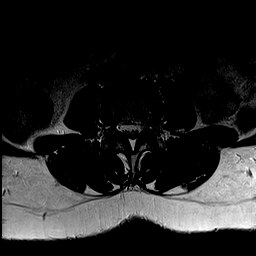
[im 22/35]
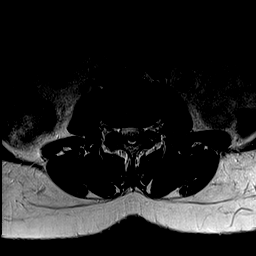
[im 25/35]
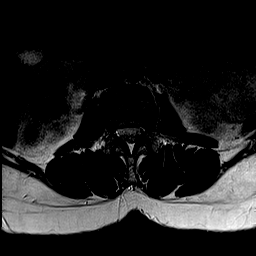
[im 27/35]
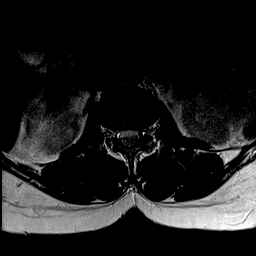
[im 30/35]
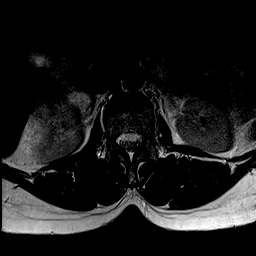
[im 32/35]
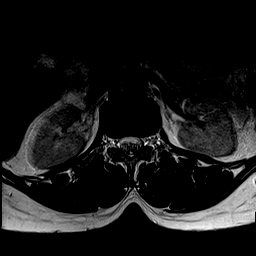
[im 35/35]
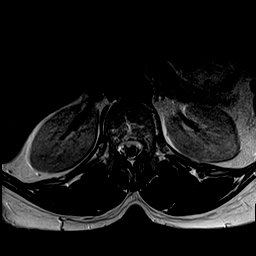

[44 of 48 positions shown; findings below may reference images not displayed]

FINDINGS: Segmentation: Transitional L5 vertebral body with articulation of
lateral processes to the sacrum.

Alignment:  Physiologic.

Vertebrae:  No fracture, evidence of discitis, or bone lesion.

Conus medullaris: Extends to the L1 level and appears normal.

Paraspinal and other soft tissues: Negative.

Disc levels:

L1-2: No significant disc displacement, foraminal narrowing, or
canal stenosis.

L2-3: Minimal disc bulge. No significant foraminal narrowing or
canal stenosis.

L3-4: Disc protrusion in the left subarticular and foraminal zones
measuring 8 x 14 mm (AP by CC, series 3: Image [REDACTED]). The disc
protrusion moderately narrows the left neural foramen, contacts the
exiting left L3 nerve root, and contacts the descending left L4
nerve root in the left lateral recess.

L4-5: Mild disc bulge and mild facet hypertrophy with mild bilateral
foraminal narrowing. No significant canal stenosis

L5-S1: No significant disc displacement, foraminal narrowing, or
canal stenosis. Mild facet hypertrophy.
IMPRESSION: 1. L3-4 left subarticular and foraminal zone disc protrusion
moderately narrowing the left neural foramen with contact on the
exiting left L3 nerve root and contact on the descending left L4
nerve root in the left lateral recess.
2. Otherwise no high-grade foraminal narrowing or canal stenosis. No
acute osseous abnormality.

By: Chrysovalantis Tittel M.D.

## 2018-07-25 ENCOUNTER — Encounter: Payer: Self-pay | Admitting: Internal Medicine

## 2018-07-25 ENCOUNTER — Ambulatory Visit: Payer: BC Managed Care – PPO | Admitting: Internal Medicine

## 2018-07-25 ENCOUNTER — Other Ambulatory Visit: Payer: Self-pay

## 2018-07-25 VITALS — BP 130/80 | HR 82 | Temp 97.7°F | Resp 16 | Ht 61.0 in | Wt 177.0 lb

## 2018-07-25 DIAGNOSIS — Z6833 Body mass index (BMI) 33.0-33.9, adult: Secondary | ICD-10-CM | POA: Diagnosis not present

## 2018-07-25 DIAGNOSIS — R635 Abnormal weight gain: Secondary | ICD-10-CM | POA: Diagnosis not present

## 2018-07-25 MED ORDER — METRONIDAZOLE 1 % EX GEL: Freq: Every day | CUTANEOUS | 3 refills | Status: DC

## 2018-07-25 MED ORDER — PHENTERMINE HCL 37.5 MG PO CAPS: 37.5000 mg | ORAL_CAPSULE | ORAL | 0 refills | Status: DC

## 2018-07-25 NOTE — Progress Notes (Signed)
   Subjective:    Patient ID: Richardson Dopp, female    DOB: Feb 16, 1960, 58 y.o.   MRN: [id number]  HPI 58 year old Female in today to discuss weight gain.  She was here in July 2019 for physical exam and weight was 147 pounds with BMI of 28.24.  Now weighs 177 pounds with BMI of 33.44. She has had success previously with phentermine for short periods of time.  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  She gets exercise with her work.  Her mother is now living with her.    Review of Systems see above     Objective:   Physical Exam She was not examined today.  She was seen in office.  Discussion about weight loss diet and exercise.  Has had success with phentermine.  Promises to make appointment for physical exam in the near future. BP is 130/80, respiratory rate 16, pulse 82 and regular      Assessment & Plan:  BMI of 33.44  Plan: Phentermine 37.5 mg 1 p.o. daily #90 no refill.  Make appointment for health maintenance exam in the near future.  TSH will be checked at that time.  She has had a 30 pound weight gain in 1 year.

## 2018-07-25 NOTE — Patient Instructions (Signed)
Phentermin 37.5 mg daily #90. To schedule CPE soon

## 2018-07-25 NOTE — Telephone Encounter (Signed)
Patient in for office visit today and requested a refill on metronidazole on her way out this afternoon. I have pended refill for patient to [facility].

## 2018-07-26 ENCOUNTER — Ambulatory Visit: Payer: BC Managed Care – PPO | Admitting: Internal Medicine

## 2018-07-29 ENCOUNTER — Other Ambulatory Visit: Payer: Self-pay | Admitting: Internal Medicine

## 2018-07-29 DIAGNOSIS — Z1231 Encounter for screening mammogram for malignant neoplasm of breast: Secondary | ICD-10-CM

## 2018-10-08 ENCOUNTER — Encounter: Payer: BC Managed Care – PPO | Admitting: Internal Medicine

## 2018-10-10 ENCOUNTER — Encounter: Payer: Self-pay | Admitting: Internal Medicine

## 2018-10-10 ENCOUNTER — Ambulatory Visit
Admission: RE | Admit: 2018-10-10 | Discharge: 2018-10-10 | Disposition: A | Discharge status: Home / Self Care | Payer: BC Managed Care – PPO | Source: Ambulatory Visit | Attending: Internal Medicine | Admitting: Internal Medicine

## 2018-10-10 ENCOUNTER — Other Ambulatory Visit: Payer: Self-pay

## 2018-10-10 ENCOUNTER — Other Ambulatory Visit (HOSPITAL_COMMUNITY)
Admission: RE | Admit: 2018-10-10 | Discharge: 2018-10-10 | Disposition: A | Discharge status: Home / Self Care | Payer: BC Managed Care – PPO | Source: Ambulatory Visit | Attending: Internal Medicine | Admitting: Internal Medicine

## 2018-10-10 ENCOUNTER — Ambulatory Visit: Payer: BC Managed Care – PPO | Admitting: Internal Medicine

## 2018-10-10 VITALS — BP 120/70 | HR 62 | Temp 98.3°F | Ht 61.0 in | Wt 165.0 lb

## 2018-10-10 DIAGNOSIS — J302 Other seasonal allergic rhinitis: Secondary | ICD-10-CM | POA: Diagnosis not present

## 2018-10-10 DIAGNOSIS — Z0000 Encounter for general adult medical examination without abnormal findings: Secondary | ICD-10-CM | POA: Diagnosis not present

## 2018-10-10 DIAGNOSIS — Z6831 Body mass index (BMI) 31.0-31.9, adult: Secondary | ICD-10-CM

## 2018-10-10 DIAGNOSIS — Z124 Encounter for screening for malignant neoplasm of cervix: Secondary | ICD-10-CM | POA: Insufficient documentation

## 2018-10-10 DIAGNOSIS — E7800 Pure hypercholesterolemia, unspecified: Secondary | ICD-10-CM

## 2018-10-10 DIAGNOSIS — Z1231 Encounter for screening mammogram for malignant neoplasm of breast: Secondary | ICD-10-CM

## 2018-10-10 LAB — CBC WITH DIFFERENTIAL/PLATELET
Absolute Monocytes: 510 cells/uL (ref 200–950)
Basophils Absolute: 41 cells/uL (ref 0–200)
Basophils Relative: 0.6 %
Eosinophils Absolute: 68 cells/uL (ref 15–500)
Eosinophils Relative: 1 %
HCT: 38.2 % (ref 35.0–45.0)
Hemoglobin: 12.7 g/dL (ref 11.7–15.5)
Lymphs Abs: 2876 cells/uL (ref 850–3900)
MCH: 29.7 pg (ref 27.0–33.0)
MCHC: 33.2 g/dL (ref 32.0–36.0)
MCV: 89.5 fL (ref 80.0–100.0)
MPV: 11.1 fL (ref 7.5–12.5)
Monocytes Relative: 7.5 %
Neutro Abs: 3305 cells/uL (ref 1500–7800)
Neutrophils Relative %: 48.6 %
Platelets: 292 10*3/uL (ref 140–400)
RBC: 4.27 10*6/uL (ref 3.80–5.10)
RDW: 12.5 % (ref 11.0–15.0)
Total Lymphocyte: 42.3 %
WBC: 6.8 10*3/uL (ref 3.8–10.8)

## 2018-10-10 LAB — POCT URINALYSIS DIPSTICK
Appearance: NEGATIVE
Bilirubin, UA: NEGATIVE
Blood, UA: NEGATIVE
Glucose, UA: NEGATIVE
Ketones, UA: NEGATIVE
Leukocytes, UA: NEGATIVE
Nitrite, UA: NEGATIVE
Odor: NEGATIVE
Protein, UA: NEGATIVE
Spec Grav, UA: 1.01 (ref 1.010–1.025)
Urobilinogen, UA: 0.2 E.U./dL
pH, UA: 6.5 (ref 5.0–8.0)

## 2018-10-10 LAB — LIPID PANEL
Cholesterol: 205 mg/dL — ABNORMAL HIGH (ref <200)
HDL: 61 mg/dL (ref >=50)
LDL Cholesterol (Calc): 129 mg/dL (calc) — ABNORMAL HIGH
Non-HDL Cholesterol (Calc): 144 mg/dL (calc) — ABNORMAL HIGH (ref <130)
Total CHOL/HDL Ratio: 3.4 (calc) (ref <5.0)
Triglycerides: 63 mg/dL (ref <150)

## 2018-10-10 LAB — COMPLETE METABOLIC PANEL WITH GFR
AG Ratio: 2 (calc) (ref 1.0–2.5)
ALT: 18 U/L (ref 6–29)
AST: 25 U/L (ref 10–35)
Albumin: 4.6 g/dL (ref 3.6–5.1)
Alkaline phosphatase (APISO): 92 U/L (ref 37–153)
BUN: 9 mg/dL (ref 7–25)
CO2: 28 mmol/L (ref 20–32)
Calcium: 10.1 mg/dL (ref 8.6–10.4)
Chloride: 105 mmol/L (ref 98–110)
Creat: 0.88 mg/dL (ref 0.50–1.05)
GFR, Est African American: 83 mL/min/{1.73_m2} (ref >=60)
GFR, Est Non African American: 72 mL/min/{1.73_m2} (ref >=60)
Globulin: 2.3 g/dL (calc) (ref 1.9–3.7)
Glucose, Bld: 92 mg/dL (ref 65–99)
Potassium: 4.6 mmol/L (ref 3.5–5.3)
Sodium: 140 mmol/L (ref 135–146)
Total Bilirubin: 0.4 mg/dL (ref 0.2–1.2)
Total Protein: 6.9 g/dL (ref 6.1–8.1)

## 2018-10-10 LAB — TSH: TSH: 1.73 mIU/L (ref 0.40–4.50)

## 2018-10-10 LAB — VITAMIN D 25 HYDROXY (VIT D DEFICIENCY, FRACTURES): Vit D, 25-Hydroxy: 41 ng/mL (ref 30–100)

## 2018-10-10 MED ORDER — PHENTERMINE HCL 37.5 MG PO CAPS: 37.5000 mg | ORAL_CAPSULE | ORAL | 0 refills | Status: DC

## 2018-10-10 NOTE — Progress Notes (Signed)
Subjective:    Patient ID: Laura Silva, female    DOB: 1960-03-11, 58 y.o.   MRN: [id number]  HPI 57 year old Female for health maintenance exam and evaluation of medical issues.  Her general health is excellent.  Doing well during the pandemic.  Continues to work at [employer] in [employer].  No known exposure to COVID-19.  Other than work does not get a lot of exercise.  Admits she is probably eating too much at home.  In 2018 she saw Dr. Vertell Limber for low back pain.  He did not feel she needed surgery.  She had an MRI of the LS spine showing L3-L4 left subarticular and foraminal zone disc protrusion with moderate narrowing of the left neural foramen with contact on the exiting left L3 nerve root.  Contacting the descending left L4 nerve root in the left lateral recess.  Remote history of sarcoidosis in 1989.  By Dr. Keturah Barre with steroids.  Has had no further exacerbations since that time.  History of allergic rhinitis.  Was allergy tested by Dr. Annamaria Boots in 1990 and was found to have allergies to trees, grass, tree pollens, dust, dust mites, tobacco and molds.  She takes over-the-counter allergy medication.  History of de Quervain's tenosynovitis November 2002 in the right wrist.  History of bacterial vaginosis.  She is menopausal.  Tubal ligation 1992.  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  Social history: She is married.  One adult son and 1 grandchild.  Does not smoke or consume alcohol.  Continues to work at [employer] in [employer].  Family history: Father died of diabetes complications.  Mother with history of rheumatoid arthritis, hypertension, hyperlipidemia.  One sister with hypertension and diabetes.  One sister died of some type of intestinal cancer.  One sister with history of morbid obesity who has had bariatric surgery.    Review of Systems no new complaints     Objective:   Physical  Exam Vitals signs reviewed.  Constitutional:      General: She is not in acute distress.    Appearance: Normal appearance. She is not toxic-appearing or diaphoretic.  HENT:     Head: Normocephalic and atraumatic.     Right Ear: Tympanic membrane normal.     Left Ear: Tympanic membrane normal.     Nose: Nose normal.     Mouth/Throat:     Mouth: Mucous membranes are moist.  Eyes:     Extraocular Movements: Extraocular movements intact.     Conjunctiva/sclera: Conjunctivae normal.     Pupils: Pupils are equal, round, and reactive to light.  Neck:     Musculoskeletal: Neck supple.     Vascular: No carotid bruit.     Comments: No carotid bruits.  No thyromegaly. Cardiovascular:     Rate and Rhythm: Normal rate and regular rhythm.     Pulses: Normal pulses.     Heart sounds: Normal heart sounds. No murmur.  Pulmonary:     Effort: Pulmonary effort is normal. No respiratory distress.     Breath sounds: Normal breath sounds. No wheezing or rales.  Abdominal:     General: Bowel sounds are normal.     Palpations: Abdomen is soft. There is no mass.     Tenderness: There is no abdominal tenderness. There is no guarding or rebound.  Genitourinary:    Comments: Pap taken.  Bimanual normal. Musculoskeletal:  Right lower leg: No edema.     Left lower leg: No edema.  Lymphadenopathy:     Cervical: No cervical adenopathy.  Skin:    General: Skin is warm and dry.     Findings: No rash.  Neurological:     General: No focal deficit present.     Mental Status: She is alert and oriented to person, place, and time.     Cranial Nerves: No cranial nerve deficit.     Gait: Gait normal.  Psychiatric:        Mood and Affect: Mood normal.        Behavior: Behavior normal.        Thought Content: Thought content normal.        Judgment: Judgment normal.           Assessment & Plan:  Elevated LDL cholesterol-needs to work on diet and get some exercise outside of work.  Given phentermine  37.5 mg to help with job starting her exercise and diet program.  1 p.o. daily with no refill.  Remote history of sarcoidosis  Seasonal allergic rhinitis  BMI 31  Plan: Return in 1 year or as needed.

## 2018-10-15 ENCOUNTER — Telehealth: Payer: Self-pay | Admitting: Internal Medicine

## 2018-10-15 NOTE — Telephone Encounter (Signed)
Laura Silva called and said she does not need this medication at this time. When she does need it she will call and she will want it filled at [facility]

## 2018-10-15 NOTE — Telephone Encounter (Signed)
[facility]  phentermine 37.5 MG capsule  Prior authorization is needed.

## 2018-10-16 LAB — CYTOLOGY - PAP
Diagnosis: NEGATIVE
HPV: NOT DETECTED

## 2018-11-02 NOTE — Patient Instructions (Signed)
It was a pleasure to see you today.  Watch fats in diet and get some exercise outside of work.  Prescription for phentermine for 90 days 1 p.o. daily with no refill for job starting diet and exercise regimen.  Return in 1 year or as needed.

## 2019-02-26 ENCOUNTER — Other Ambulatory Visit: Payer: Self-pay | Admitting: Internal Medicine

## 2019-02-26 MED ORDER — PHENTERMINE HCL 37.5 MG PO CAPS: 37.5000 mg | ORAL_CAPSULE | ORAL | 0 refills | Status: DC

## 2019-02-26 NOTE — Telephone Encounter (Signed)
Torren Durnan [phone]  [name] called to say she had decided to go back on the medication below and would like it sent in. With the holidays and everything going on she is just eating to much and needs some assistance to curve her hunger.   [place], [place] Phone:  [phone]  Fax:  [phone]       phentermine 37.5 MG capsule

## 2019-02-26 NOTE — Telephone Encounter (Signed)
Had CPE on 10/10/18.

## 2019-03-04 ENCOUNTER — Telehealth: Payer: Self-pay | Admitting: Internal Medicine

## 2019-03-04 NOTE — Telephone Encounter (Signed)
Laura Silva [phone]  [name] called to say that her insurance did not cover her Vit D lab, said they did not see any benefit for her having this lab. She wants to make sure before doing any labs that her insurance is going to cover or that she is told ahead of time.

## 2019-10-02 ENCOUNTER — Other Ambulatory Visit: Payer: Self-pay | Admitting: Internal Medicine

## 2019-10-02 DIAGNOSIS — Z1231 Encounter for screening mammogram for malignant neoplasm of breast: Secondary | ICD-10-CM

## 2019-10-15 ENCOUNTER — Ambulatory Visit (INDEPENDENT_AMBULATORY_CARE_PROVIDER_SITE_OTHER): Payer: BC Managed Care – PPO | Admitting: Internal Medicine

## 2019-10-15 ENCOUNTER — Encounter: Payer: Self-pay | Admitting: Internal Medicine

## 2019-10-15 ENCOUNTER — Other Ambulatory Visit: Payer: Self-pay

## 2019-10-15 DIAGNOSIS — Z0289 Encounter for other administrative examinations: Secondary | ICD-10-CM | POA: Diagnosis not present

## 2019-10-15 NOTE — Patient Instructions (Signed)
Form will be completed for FMLA leave from work August 31 through December 31 of this year due to your mother's illness.

## 2019-10-15 NOTE — Progress Notes (Signed)
° °  Subjective:    Silva ID: Laura Silva, female    DOB: 1960-12-13, 59 y.o.   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 An exact diagnosis is pending. Laura mother has a history of rheumatoid arthritis and has been under Laura care of a rheumatologist.  There is a remote history of iron deficiency anemia evaluated by Laura Silva.  Laura Silva tells me that Laura mother is at home alone during Laura day.  She and Laura husband both work and Laura son no longer lives at home.  This is concerning to Laura Silva that Laura mother is left there alone.  She feels that Laura mother has declined over Laura past few weeks and is weak and lethargic.  She has a form that needs to be completed today regarding FMLA.  She also tells me she will be retiring at Laura end of December and is requesting FMLA for December 31.    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  This visit is a consultation to review Laura request.  Silva brings in a form that needs to be completed for FMLA.  A 20-minute discussion ensued regarding appropriateness of medical leave due to Laura mother's illness.  She is Laura Silva at this point in time even though there are other siblings.  Laura Silva is residing with Laura Silva at Laura present time.       Assessment & Plan:  Request for family medical leave due to mother's illness: thrombocytosis  Form completed as requested.  Silva requests that she be out of work from August 31 through December 31 of this year at which time she will be retiring from Laura job.

## 2019-12-29 ENCOUNTER — Other Ambulatory Visit: Payer: BC Managed Care – PPO | Admitting: Internal Medicine

## 2019-12-29 ENCOUNTER — Ambulatory Visit
Admission: RE | Admit: 2019-12-29 | Discharge: 2019-12-29 | Disposition: A | Discharge status: Home / Self Care | Payer: BC Managed Care – PPO | Source: Ambulatory Visit | Attending: Internal Medicine | Admitting: Internal Medicine

## 2019-12-29 ENCOUNTER — Other Ambulatory Visit: Payer: Self-pay

## 2019-12-29 DIAGNOSIS — Z1231 Encounter for screening mammogram for malignant neoplasm of breast: Secondary | ICD-10-CM

## 2019-12-29 DIAGNOSIS — J302 Other seasonal allergic rhinitis: Secondary | ICD-10-CM

## 2019-12-29 DIAGNOSIS — E7800 Pure hypercholesterolemia, unspecified: Secondary | ICD-10-CM

## 2019-12-29 DIAGNOSIS — Z0000 Encounter for general adult medical examination without abnormal findings: Secondary | ICD-10-CM

## 2019-12-30 ENCOUNTER — Encounter: Payer: Self-pay | Admitting: Internal Medicine

## 2019-12-30 ENCOUNTER — Ambulatory Visit (INDEPENDENT_AMBULATORY_CARE_PROVIDER_SITE_OTHER): Payer: BC Managed Care – PPO | Admitting: Internal Medicine

## 2019-12-30 VITALS — BP 130/80 | HR 75 | Ht 61.0 in | Wt 186.0 lb

## 2019-12-30 DIAGNOSIS — Z6835 Body mass index (BMI) 35.0-35.9, adult: Secondary | ICD-10-CM

## 2019-12-30 DIAGNOSIS — E7800 Pure hypercholesterolemia, unspecified: Secondary | ICD-10-CM | POA: Diagnosis not present

## 2019-12-30 DIAGNOSIS — Z9109 Other allergy status, other than to drugs and biological substances: Secondary | ICD-10-CM | POA: Diagnosis not present

## 2019-12-30 DIAGNOSIS — Z0000 Encounter for general adult medical examination without abnormal findings: Secondary | ICD-10-CM | POA: Diagnosis not present

## 2019-12-30 DIAGNOSIS — Z23 Encounter for immunization: Secondary | ICD-10-CM

## 2019-12-30 LAB — COMPLETE METABOLIC PANEL WITH GFR
AG Ratio: 1.9 (calc) (ref 1.0–2.5)
ALT: 13 U/L (ref 6–29)
AST: 17 U/L (ref 10–35)
Albumin: 4.5 g/dL (ref 3.6–5.1)
Alkaline phosphatase (APISO): 96 U/L (ref 37–153)
BUN: 9 mg/dL (ref 7–25)
CO2: 22 mmol/L (ref 20–32)
Calcium: 9.6 mg/dL (ref 8.6–10.4)
Chloride: 106 mmol/L (ref 98–110)
Creat: 0.99 mg/dL (ref 0.50–0.99)
GFR, Est African American: 72 mL/min/{1.73_m2} (ref >=60)
GFR, Est Non African American: 62 mL/min/{1.73_m2} (ref >=60)
Globulin: 2.4 g/dL (calc) (ref 1.9–3.7)
Glucose, Bld: 92 mg/dL (ref 65–99)
Potassium: 4 mmol/L (ref 3.5–5.3)
Sodium: 141 mmol/L (ref 135–146)
Total Bilirubin: 0.3 mg/dL (ref 0.2–1.2)
Total Protein: 6.9 g/dL (ref 6.1–8.1)

## 2019-12-30 LAB — CBC WITH DIFFERENTIAL/PLATELET
Absolute Monocytes: 431 cells/uL (ref 200–950)
Basophils Absolute: 54 cells/uL (ref 0–200)
Basophils Relative: 0.7 %
Eosinophils Absolute: 62 cells/uL (ref 15–500)
Eosinophils Relative: 0.8 %
HCT: 37.6 % (ref 35.0–45.0)
Hemoglobin: 12.5 g/dL (ref 11.7–15.5)
Lymphs Abs: 3835 cells/uL (ref 850–3900)
MCH: 30 pg (ref 27.0–33.0)
MCHC: 33.2 g/dL (ref 32.0–36.0)
MCV: 90.2 fL (ref 80.0–100.0)
MPV: 11.2 fL (ref 7.5–12.5)
Monocytes Relative: 5.6 %
Neutro Abs: 3319 cells/uL (ref 1500–7800)
Neutrophils Relative %: 43.1 %
Platelets: 296 10*3/uL (ref 140–400)
RBC: 4.17 10*6/uL (ref 3.80–5.10)
RDW: 12.4 % (ref 11.0–15.0)
Total Lymphocyte: 49.8 %
WBC: 7.7 10*3/uL (ref 3.8–10.8)

## 2019-12-30 LAB — POCT URINALYSIS DIPSTICK
Appearance: NEGATIVE
Bilirubin, UA: NEGATIVE
Blood, UA: NEGATIVE
Glucose, UA: NEGATIVE
Ketones, UA: NEGATIVE
Leukocytes, UA: NEGATIVE
Nitrite, UA: NEGATIVE
Odor: NEGATIVE
Protein, UA: NEGATIVE
Spec Grav, UA: 1.01 (ref 1.010–1.025)
Urobilinogen, UA: 0.2 E.U./dL
pH, UA: 6.5 (ref 5.0–8.0)

## 2019-12-30 LAB — LIPID PANEL
Cholesterol: 197 mg/dL (ref <200)
HDL: 61 mg/dL (ref >=50)
LDL Cholesterol (Calc): 119 mg/dL (calc) — ABNORMAL HIGH
Non-HDL Cholesterol (Calc): 136 mg/dL (calc) — ABNORMAL HIGH (ref <130)
Total CHOL/HDL Ratio: 3.2 (calc) (ref <5.0)
Triglycerides: 75 mg/dL (ref <150)

## 2019-12-30 LAB — TSH: TSH: 1.69 mIU/L (ref 0.40–4.50)

## 2019-12-30 MED ORDER — PHENTERMINE HCL 37.5 MG PO CAPS: 37.5000 mg | ORAL_CAPSULE | ORAL | 0 refills | Status: DC

## 2019-12-30 NOTE — Progress Notes (Signed)
Subjective:    Patient ID: Laura Silva, female    DOB: [birth year], 59 y.o.   MRN: [id number]  HPI 59 year old female seen for health maintenance exam and evaluation of medical issues.  Her general health is excellent.  Has done well during the pandemic.  Was working at [employer] in [employer] but has been on [employer] because mother has medical issues and stays with her.  Patient had J&J vaccine for Covid March 2021.  In 2018 she saw Dr. Vertell Limber for low back pain.  He did not feel she needed surgery.  She had an MRI of the LS spine showing L3-L4 left subarticular and foraminal zone disc protrusion with moderate narrowing of the left neural foramen with contact on the exiting left L3 nerve root and contact on the descending left L4 nerve root in the left lateral recess.  Remote history of sarcoidosis in 1989.  Treated by Dr. Keturah Barre with steroids and has had no further exacerbations since that time.  History of allergic rhinitis.  Was allergy tested by Dr. Annamaria Boots in 1990 and was found to have allergies to trees, grass, tree pollens, dust, dust mites tobacco and molds.  She takes over-the-counter allergy medication.  History of de Quervain's tenosynovitis November 2002 and right wrist.  History of bacterial vaginosis but not recently.  She is menopausal.  Tubal ligation 1992.  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  Social history: She is married.  1 adult son.  Does not smoke or consume alcohol.  Mother has rheumatoid arthritis and essential thrombocytosis and resides with patient.  Family history: Father died of diabetes complications.  Mother with history of rheumatoid arthritis, hypertension, hyperlipidemia, essential thrombocytosis.  1 sister with history of morbid obesity who had bariatric surgery.  1 sister died of some type of intestinal cancer.  1 sister with hypertension and diabetes.    Review of Systems   Constitutional: Negative.   All other systems reviewed and are negative.      Objective:   Physical Exam Vitals reviewed.  Constitutional:      Appearance: Normal appearance.  HENT:     Head: Normocephalic.     Right Ear: Tympanic membrane normal.     Left Ear: Tympanic membrane normal.     Nose: Nose normal.  Eyes:     Conjunctiva/sclera: Conjunctivae normal.     Pupils: Pupils are equal, round, and reactive to light.  Neck:     Vascular: No carotid bruit.     Comments: No thyromegaly Cardiovascular:     Rate and Rhythm: Normal rate and regular rhythm.     Heart sounds: Normal heart sounds. No murmur heard.   Pulmonary:     Effort: Pulmonary effort is normal.     Breath sounds: Normal breath sounds. No wheezing or rales.  Abdominal:     General: Bowel sounds are normal.     Palpations: Abdomen is soft. There is no mass.     Tenderness: There is no guarding or rebound.  Genitourinary:    Comments: Pap taken 2020. Bimanual normal. Musculoskeletal:     Cervical back: Neck supple. No rigidity.     Right lower leg: No edema.     Left lower leg: No edema.  Lymphadenopathy:     Cervical: No cervical adenopathy.  Neurological:     General: No focal deficit present.     Mental Status: She is alert.  Psychiatric:  Mood and Affect: Mood normal.        Behavior: Behavior normal.        Thought Content: Thought content normal.        Judgment: Judgment normal.   Skin warm and dry.  Nodes none.        Assessment & Plan:  Remote history of sarcoidosis 1989-treated with steroids by Dr. Annamaria Boots and has never had recurrence  Environmental allergies treated with over-the-counter allergy medication.  Was allergy tested by Dr. Annamaria Boots in 1990.  BMI 35.14 admit she has not been exercising.  I did help her mother who has had multiple medical appointments.  Needs to watch diet.  We have agreed to give her phentermine for 90 days only.  She will need to pay out-of-pocket for  this prescription.  Elevated LDL of 119 improved from a year ago at 129 he does not want to be on statin medication  Had colonoscopy in 2013 with 10-year follow-up recommended.  Mammogram is up-to-date.  Needs J&J booster.  Flu vaccine given.  Have annual mammogram.  Return in 1 year or as needed.

## 2020-01-05 ENCOUNTER — Telehealth: Payer: Self-pay | Admitting: Internal Medicine

## 2020-01-05 MED ORDER — PHENTERMINE HCL 37.5 MG PO CAPS
37.5000 mg | ORAL_CAPSULE | ORAL | 0 refills | Status: DC
Start: 2020-01-05 — End: 2021-11-25

## 2020-01-05 NOTE — Telephone Encounter (Signed)
Please sign rx

## 2020-01-05 NOTE — Telephone Encounter (Signed)
Pt called and said that [facility] said they did not receive order for phentermine 37.5 MG capsule. Pt asked if it could be sent in again. Please advise

## 2020-01-06 ENCOUNTER — Encounter: Payer: Self-pay | Admitting: Internal Medicine

## 2020-01-06 ENCOUNTER — Telehealth: Payer: Self-pay | Admitting: Internal Medicine

## 2020-01-06 NOTE — Telephone Encounter (Signed)
Spoke with pharamacist. She will pay out of pocket.

## 2020-01-06 NOTE — Telephone Encounter (Signed)
Pt called and said the pharmacy called and told her they received the script but said they wont fill it until they have Dr Verlene Mayer approval. Please advise

## 2020-01-06 NOTE — Telephone Encounter (Signed)
Spoke with  pharmacist. Patient will pay out of pocket for Phentermine so prior auth not required. MJB,MD

## 2020-02-03 ENCOUNTER — Telehealth: Payer: Self-pay | Admitting: Internal Medicine

## 2020-02-03 NOTE — Telephone Encounter (Signed)
Laura Silva [phone]  [place] just called to say she had been to the [facility] and had a Rapid COVID-19 test and it has come back positive, she is feeling achy all over and has a bad headache. She did receive Laural Benes and [employer] vaccine.

## 2020-02-06 NOTE — Patient Instructions (Signed)
Need to work on diet exercise and weight loss.  Have given 90-day prescription for phentermine which she will pay for out-of-pocket.  Have her get a job started on diet and exercise regimen.  Flu vaccine given.  Needs J&J booster.  Return in 1 year.

## 2020-04-15 ENCOUNTER — Telehealth: Payer: Self-pay | Admitting: Family Medicine

## 2020-04-15 DIAGNOSIS — Z0289 Encounter for other administrative examinations: Secondary | ICD-10-CM

## 2020-04-15 NOTE — Telephone Encounter (Signed)
Received request from Dr Rachell Cipro office for medical records, sent 5 years of records thru system and faxed labs, medication list, H&P (60 pages all together) Changed PCP's in the system.

## 2020-11-19 ENCOUNTER — Other Ambulatory Visit: Payer: Self-pay | Admitting: Family Medicine

## 2020-11-19 DIAGNOSIS — Z1231 Encounter for screening mammogram for malignant neoplasm of breast: Secondary | ICD-10-CM

## 2020-12-29 ENCOUNTER — Ambulatory Visit
Admission: RE | Admit: 2020-12-29 | Discharge: 2020-12-29 | Disposition: A | Discharge status: Home / Self Care | Payer: BC Managed Care – PPO | Source: Ambulatory Visit | Attending: Family Medicine | Admitting: Family Medicine

## 2020-12-29 ENCOUNTER — Other Ambulatory Visit: Payer: Self-pay

## 2020-12-29 DIAGNOSIS — Z1231 Encounter for screening mammogram for malignant neoplasm of breast: Secondary | ICD-10-CM

## 2021-01-03 ENCOUNTER — Other Ambulatory Visit: Payer: BC Managed Care – PPO | Admitting: Internal Medicine

## 2021-01-04 ENCOUNTER — Encounter: Payer: BC Managed Care – PPO | Admitting: Internal Medicine

## 2021-10-24 ENCOUNTER — Encounter: Payer: Self-pay | Admitting: Internal Medicine

## 2021-10-27 ENCOUNTER — Encounter: Payer: Self-pay | Admitting: Internal Medicine

## 2021-11-24 ENCOUNTER — Other Ambulatory Visit: Payer: Self-pay | Admitting: Family Medicine

## 2021-11-24 DIAGNOSIS — Z1231 Encounter for screening mammogram for malignant neoplasm of breast: Secondary | ICD-10-CM

## 2021-11-25 ENCOUNTER — Ambulatory Visit (AMBULATORY_SURGERY_CENTER): Payer: Self-pay

## 2021-11-25 VITALS — Ht 61.0 in | Wt 164.0 lb

## 2021-11-25 DIAGNOSIS — Z1211 Encounter for screening for malignant neoplasm of colon: Secondary | ICD-10-CM

## 2021-11-25 NOTE — Progress Notes (Signed)
No egg or soy allergy known to patient  No issues known to pt with past sedation with any surgeries or procedures Patient denies ever being told they had issues or difficulty with intubation  No FH of Malignant Hyperthermia Pt is not on diet pills Pt is not on home 02  Pt is not on blood thinners  Pt denies issues with constipation; No A fib or A flutter Have any cardiac testing pending--NO Pt instructed to use Singlecare.com or GoodRx for a price reduction on prep    Patient's chart reviewed by Osvaldo Angst CNRA prior to previsit and patient appropriate for the [place].  Previsit completed and red dot placed by patient's name on their procedure day (on provider's schedule).     Insurance verified during [facility] appt=[employer] [place]

## 2021-12-13 ENCOUNTER — Encounter: Payer: Self-pay | Admitting: Internal Medicine

## 2021-12-22 ENCOUNTER — Ambulatory Visit (AMBULATORY_SURGERY_CENTER): Payer: BC Managed Care – PPO | Admitting: Internal Medicine

## 2021-12-22 ENCOUNTER — Encounter: Payer: Self-pay | Admitting: Internal Medicine

## 2021-12-22 VITALS — BP 128/60 | HR 57 | Temp 96.2°F | Resp 15 | Ht 61.0 in | Wt 164.0 lb

## 2021-12-22 DIAGNOSIS — D123 Benign neoplasm of transverse colon: Secondary | ICD-10-CM

## 2021-12-22 DIAGNOSIS — D122 Benign neoplasm of ascending colon: Secondary | ICD-10-CM | POA: Diagnosis not present

## 2021-12-22 DIAGNOSIS — Z1211 Encounter for screening for malignant neoplasm of colon: Secondary | ICD-10-CM

## 2021-12-22 MED ORDER — SODIUM CHLORIDE 0.9 % IV SOLN: 500.0000 mL | Freq: Once | INTRAVENOUS | Status: DC

## 2021-12-22 NOTE — Progress Notes (Signed)
Sedate, gd SR, tolerated procedure well, VSS, report to RN 

## 2021-12-22 NOTE — Patient Instructions (Addendum)
YOU HAD AN ENDOSCOPIC PROCEDURE TODAY AT [facility]:   Refer to the procedure report that was given to you for any specific questions about what was found during the examination.  If the procedure report does not answer your questions, please call your gastroenterologist to clarify.  If you requested that your care partner not be given the details of your procedure findings, then the procedure report has been included in a sealed envelope for you to review at your convenience later.  YOU SHOULD EXPECT: Some feelings of bloating in the abdomen. Passage of more gas than usual.  Walking can help get rid of the air that was put into your GI tract during the procedure and reduce the bloating. If you had a lower endoscopy (such as a colonoscopy or flexible sigmoidoscopy) you may notice spotting of blood in your stool or on the toilet paper. If you underwent a bowel prep for your procedure, you may not have a normal bowel movement for a few days.  Please Note:  You might notice some irritation and congestion in your nose or some drainage.  This is from the oxygen used during your procedure.  There is no need for concern and it should clear up in a day or so.  SYMPTOMS TO REPORT IMMEDIATELY:  Following lower endoscopy (colonoscopy or flexible sigmoidoscopy):  Excessive amounts of blood in the stool  Significant tenderness or worsening of abdominal pains  Swelling of the abdomen that is new, acute  Fever of 100F or higher  For urgent or emergent issues, a gastroenterologist can be reached at any hour by calling [phone]. Do not use MyChart messaging for urgent concerns.    DIET:  We do recommend a small meal at first, but then you may proceed to your regular diet.  Drink plenty of fluids but you should avoid alcoholic beverages for 24 hours.  ACTIVITY:  You should plan to take it easy for the rest of today and you should NOT DRIVE or use heavy machinery until tomorrow (because of  the sedation medicines used during the test).    FOLLOW UP: Our staff will call the number listed on your records the next business day following your procedure.  We will call around 7:15- 8:00 am to check on you and address any questions or concerns that you may have regarding the information given to you following your procedure. If we do not reach you, we will leave a message.     If any biopsies were taken you will be contacted by phone or by letter within the next 1-3 weeks.  Please call us at [phone] if you have not heard about the biopsies in 3 weeks.    SIGNATURES/CONFIDENTIALITY: You and/or your care partner have signed paperwork which will be entered into your electronic medical record.  These signatures attest to the fact that that the information above on your After Visit Summary has been reviewed and is understood.  Full responsibility of the confidentiality of this discharge information lies with you and/or your care-partner.I found and removed 3 tiny polyps that look benign. I will let you know pathology results and when to have another routine colonoscopy by mail and/or My Chart.  You also have a condition called diverticulosis - common and not usually a problem. Please read the handout provided.  Please resume all medications and your normal diet.  I appreciate the opportunity to care for you. Gatha Mayer, MD, [facility]

## 2021-12-22 NOTE — Progress Notes (Signed)
[facility] History and Physical   Primary Care Physician:  Fanny Bien, MD   Reason for Procedure:   CRCA screening  Plan:    colonoscopy     HPI: Laura Silva is a 61 y.o. female here for colon cancer screening   Past Medical History:  Diagnosis Date   Allergy    Arthritis    bilateral lower extremities   Constipation    Environmental allergies    Fluid retention    GERD (gastroesophageal reflux disease)    hx of   Herpes simplex virus type 1 (HSV-1) dermatitis    Iron deficiency    Sarcoidosis    Seasonal allergies     Past Surgical History:  Procedure Laterality Date   COLONOSCOPY  2013   CG-movi(exc)-   DILATION AND CURETTAGE OF UTERUS     TUBAL LIGATION     WISDOM TOOTH EXTRACTION     not under sedation    Prior to Admission medications   Medication Sig Start Date End Date Taking? Authorizing Provider  Loratadine (ALLERGY RELIEF 24-HR PO) Take 1 tablet by mouth daily at 6 (six) AM.   Yes [provider]  Multiple Vitamins-Minerals (CENTRUM PO) Take 1 tablet by mouth daily at 6 (six) AM.   Yes [provider]    Current Outpatient Medications  Medication Sig Dispense Refill   Loratadine (ALLERGY RELIEF 24-HR PO) Take 1 tablet by mouth daily at 6 (six) AM.     Multiple Vitamins-Minerals (CENTRUM PO) Take 1 tablet by mouth daily at 6 (six) AM.     Current Facility-Administered Medications  Medication Dose Route Frequency Provider Last Rate Last Admin   0.9 %  sodium chloride infusion  500 mL Intravenous Once Gatha Mayer, MD        Allergies as of 12/22/2021   (No Known Allergies)    Family History  Problem Relation Age of Onset   Hyperlipidemia Mother    Hypertension Mother    Diabetes Mother    Heart disease Mother    Diabetes Father    Colon cancer Paternal Uncle    Breast cancer Neg Hx    Colon polyps Neg Hx    Esophageal cancer Neg Hx    Rectal cancer Neg Hx    Stomach cancer Neg Hx      Social History   Socioeconomic History   Marital status: Married    Spouse name: Not on file   Number of children: Not on file   Years of education: Not on file   Highest education level: Not on file  Occupational History   Not on file  Tobacco Use   Smoking status: Former    Types: Cigarettes    Quit date: 12/12/1980    Years since quitting: 41.0   Smokeless tobacco: Never  Vaping Use   Vaping Use: Never used  Substance and Sexual Activity   Alcohol use: No   Drug use: No   Sexual activity: Not on file  Other Topics Concern   Not on file  Social History Narrative   Not on file   Social Determinants of Health   Financial Resource Strain: Not on file  Food Insecurity: Not on file  Transportation Needs: Not on file  Physical Activity: Not on file  Stress: Not on file  Social Connections: Not on file  Intimate Partner Violence: Not on file    Review of Systems:  All other review of systems negative except as mentioned  in the HPI.  Physical Exam: Vital signs BP 116/75   Pulse 61   Temp (!) 96.2 F (35.7 C) (Temporal)   Ht '5\' 1"'$  (1.549 m)   Wt 164 lb (74.4 kg)   LMP 10/20/2013 (Exact Date)   SpO2 98%   BMI 30.99 kg/m   General:   Alert,  Well-developed, well-nourished, pleasant and cooperative in NAD Lungs:  Clear throughout to auscultation.   Heart:  Regular rate and rhythm; no murmurs, clicks, rubs,  or gallops. Abdomen:  Soft, nontender and nondistended. Normal bowel sounds.   Neuro/Psych:  Alert and cooperative. Normal mood and affect. A and O x 3   '@Laura Silva'$  [name], MD, [facility] [phone] (pager) 12/22/2021 8:02 AM@

## 2021-12-22 NOTE — Progress Notes (Signed)
Called to room to assist during endoscopic procedure.  Patient ID and intended procedure confirmed with present staff. Received instructions for my participation in the procedure from the performing physician.  

## 2021-12-22 NOTE — Op Note (Signed)
[employer] Patient Name: Laura Silva Procedure Date: 12/22/2021 8:01 AM MRN: [id number] Endoscopist: Gatha Mayer , MD, [id number] Age: 61 Referring MD:  Date of Birth: 11-14-1960 Gender: Female Account #: [id number] Procedure:                Colonoscopy Indications:              Screening for colorectal malignant neoplasm, Last                            colonoscopy: 2013 Medicines:                Monitored Anesthesia Care Procedure:                Pre-Anesthesia Assessment:                           - Prior to the procedure, a History and Physical                            was performed, and patient medications and                            allergies were reviewed. The patient's tolerance of                            previous anesthesia was also reviewed. The risks                            and benefits of the procedure and the sedation                            options and risks were discussed with the patient.                            All questions were answered, and informed consent                            was obtained. Prior Anticoagulants: The patient has                            taken no anticoagulant or antiplatelet agents. ASA                            Grade Assessment: II - A patient with mild systemic                            disease. After reviewing the risks and benefits,                            the patient was deemed in satisfactory condition to                            undergo the procedure.  After obtaining informed consent, the colonoscope                            was passed under direct vision. Throughout the                            procedure, the patient's blood pressure, pulse, and                            oxygen saturations were monitored continuously. The                            CF [id number] #[id number] was introduced through the anus                            and advanced to the the cecum,  identified by                            appendiceal orifice and ileocecal valve. The                            colonoscopy was somewhat difficult due to                            significant looping. Successful completion of the                            procedure was aided by using manual pressure and                            straightening and shortening the scope to obtain                            bowel loop reduction. The patient tolerated the                            procedure well. The quality of the bowel                            preparation was good. The bowel preparation used                            was Miralax via split dose instruction. The                            ileocecal valve, appendiceal orifice, and rectum                            were photographed. Scope In: 8:10:03 AM Scope Out: 8:36:10 AM Scope Withdrawal Time: 0 hours 20 minutes 25 seconds  Total Procedure Duration: 0 hours 26 minutes 7 seconds  Findings:                 The perianal and digital rectal examinations were  normal.                           Two sessile polyps were found in the transverse                            colon. The polyps were 2 to 3 mm in size. These                            polyps were removed with a cold snare. Resection                            and retrieval were complete. Verification of                            patient identification for the specimen was done.                            Estimated blood loss was minimal.                           A 1 mm polyp was found in the ascending colon. The                            polyp was sessile. The polyp was removed with a                            cold biopsy forceps. Resection and retrieval were                            complete. Verification of patient identification                            for the specimen was done. Estimated blood loss was                            minimal.                            Scattered diverticula were found in the left colon.                           The exam was otherwise without abnormality on                            direct and retroflexion views. Complications:            No immediate complications. Estimated Blood Loss:     Estimated blood loss was minimal. Impression:               - Two 2 to 3 mm polyps in the transverse colon,                            removed with a cold snare. Resected and retrieved.                           -  One 1 mm polyp in the ascending colon, removed                            with a cold biopsy forceps. Resected and retrieved.                           - Diverticulosis in the left colon.                           - The examination was otherwise normal on direct                            and retroflexion views. Recommendation:           - Patient has a contact number available for                            emergencies. The signs and symptoms of potential                            delayed complications were discussed with the                            patient. Return to normal activities tomorrow.                            Written discharge instructions were provided to the                            patient.                           - Resume previous diet.                           - Continue present medications.                           - Repeat colonoscopy is recommended. The                            colonoscopy date will be determined after pathology                            results from today's exam become available for                            review. Gatha Mayer, MD 12/22/2021 8:43:48 AM This report has been signed electronically.

## 2021-12-22 NOTE — Progress Notes (Signed)
Pt's states no medical or surgical changes since previsit or office visit. 

## 2021-12-23 ENCOUNTER — Telehealth: Payer: Self-pay

## 2021-12-23 NOTE — Telephone Encounter (Signed)
Patient returned call, stated that she is doing well.

## 2021-12-23 NOTE — Telephone Encounter (Signed)
Follow up call placed, message left

## 2021-12-28 ENCOUNTER — Encounter: Payer: Self-pay | Admitting: Internal Medicine

## 2021-12-28 DIAGNOSIS — Z86010 Personal history of colonic polyps: Secondary | ICD-10-CM | POA: Insufficient documentation

## 2021-12-28 DIAGNOSIS — Z860101 Personal history of adenomatous and serrated colon polyps: Secondary | ICD-10-CM

## 2021-12-28 HISTORY — DX: Personal history of adenomatous and serrated colon polyps: Z86.0101

## 2022-01-12 ENCOUNTER — Ambulatory Visit
Admission: RE | Admit: 2022-01-12 | Discharge: 2022-01-12 | Disposition: A | Discharge status: Home / Self Care | Payer: BC Managed Care – PPO | Source: Ambulatory Visit | Attending: Family Medicine | Admitting: Family Medicine

## 2022-01-12 DIAGNOSIS — Z1231 Encounter for screening mammogram for malignant neoplasm of breast: Secondary | ICD-10-CM

## 2022-12-07 ENCOUNTER — Other Ambulatory Visit: Payer: Self-pay | Admitting: Family Medicine

## 2022-12-07 DIAGNOSIS — Z1231 Encounter for screening mammogram for malignant neoplasm of breast: Secondary | ICD-10-CM

## 2023-01-15 ENCOUNTER — Ambulatory Visit
Admission: RE | Admit: 2023-01-15 | Discharge: 2023-01-15 | Disposition: A | Discharge status: Home / Self Care | Payer: BC Managed Care – PPO | Source: Ambulatory Visit | Attending: Family Medicine | Admitting: Family Medicine

## 2023-01-15 DIAGNOSIS — Z1231 Encounter for screening mammogram for malignant neoplasm of breast: Secondary | ICD-10-CM

## 2023-01-17 ENCOUNTER — Other Ambulatory Visit: Payer: Self-pay | Admitting: Family Medicine

## 2023-01-17 DIAGNOSIS — R928 Other abnormal and inconclusive findings on diagnostic imaging of breast: Secondary | ICD-10-CM

## 2023-02-05 ENCOUNTER — Ambulatory Visit
Admission: RE | Admit: 2023-02-05 | Discharge: 2023-02-05 | Disposition: A | Discharge status: Home / Self Care | Payer: BC Managed Care – PPO | Source: Ambulatory Visit | Attending: Family Medicine | Admitting: Family Medicine

## 2023-02-05 DIAGNOSIS — R928 Other abnormal and inconclusive findings on diagnostic imaging of breast: Secondary | ICD-10-CM

## 2023-12-03 ENCOUNTER — Other Ambulatory Visit: Payer: Self-pay | Admitting: Family Medicine

## 2023-12-03 DIAGNOSIS — Z1231 Encounter for screening mammogram for malignant neoplasm of breast: Secondary | ICD-10-CM

## 2024-01-16 ENCOUNTER — Inpatient Hospital Stay
Admission: RE | Admit: 2024-01-16 | Discharge: 2024-01-16 | Payer: Self-pay | Attending: Family Medicine | Admitting: Family Medicine

## 2024-01-16 DIAGNOSIS — Z1231 Encounter for screening mammogram for malignant neoplasm of breast: Secondary | ICD-10-CM
